# Patient Record
Sex: Female | Born: 1970 | Race: White | Hispanic: No | Smoking: Never smoker
Health system: Southern US, Community
[De-identification: ages and names within clinical notes are randomized; demographics above are authoritative.]

## PROBLEM LIST (undated history)

## (undated) DIAGNOSIS — E785 Hyperlipidemia, unspecified: Secondary | ICD-10-CM

## (undated) HISTORY — PX: REDUCTION MAMMAPLASTY: SUR839

## (undated) HISTORY — PX: EYE SURGERY: SHX253

## (undated) HISTORY — DX: Hyperlipidemia, unspecified: E78.5

## (undated) HISTORY — PX: TUBAL LIGATION: SHX77

## (undated) HISTORY — PX: VESICOVAGINAL FISTULA CLOSURE W/ TAH: SUR271

## (undated) HISTORY — PX: BREAST ENHANCEMENT SURGERY: SHX7

---

## 1997-12-10 ENCOUNTER — Other Ambulatory Visit: Admission: RE | Admit: 1997-12-10 | Discharge: 1997-12-10 | Payer: Self-pay | Admitting: Gynecology

## 1998-04-28 ENCOUNTER — Ambulatory Visit (HOSPITAL_COMMUNITY): Admission: AD | Admit: 1998-04-28 | Discharge: 1998-04-28 | Payer: Self-pay | Admitting: Obstetrics & Gynecology

## 1998-12-09 ENCOUNTER — Inpatient Hospital Stay (HOSPITAL_COMMUNITY): Admission: AD | Admit: 1998-12-09 | Discharge: 1998-12-11 | Payer: Self-pay | Admitting: *Deleted

## 1999-01-06 ENCOUNTER — Other Ambulatory Visit: Admission: RE | Admit: 1999-01-06 | Discharge: 1999-01-06 | Payer: Self-pay | Admitting: Obstetrics and Gynecology

## 1999-03-29 ENCOUNTER — Emergency Department (HOSPITAL_COMMUNITY): Admission: EM | Admit: 1999-03-29 | Discharge: 1999-03-29 | Payer: Self-pay | Admitting: Emergency Medicine

## 1999-09-15 ENCOUNTER — Other Ambulatory Visit: Admission: RE | Admit: 1999-09-15 | Discharge: 1999-09-15 | Payer: Self-pay | Admitting: Gynecology

## 2001-01-20 ENCOUNTER — Other Ambulatory Visit: Admission: RE | Admit: 2001-01-20 | Discharge: 2001-01-20 | Payer: Self-pay | Admitting: Gynecology

## 2001-09-16 ENCOUNTER — Encounter: Payer: Self-pay | Admitting: *Deleted

## 2001-09-16 ENCOUNTER — Ambulatory Visit (HOSPITAL_COMMUNITY): Admission: RE | Admit: 2001-09-16 | Discharge: 2001-09-16 | Payer: Self-pay | Admitting: *Deleted

## 2002-02-07 ENCOUNTER — Other Ambulatory Visit: Admission: RE | Admit: 2002-02-07 | Discharge: 2002-02-07 | Payer: Self-pay | Admitting: Gynecology

## 2003-02-13 ENCOUNTER — Other Ambulatory Visit: Admission: RE | Admit: 2003-02-13 | Discharge: 2003-02-13 | Payer: Self-pay | Admitting: Gynecology

## 2004-02-06 ENCOUNTER — Other Ambulatory Visit: Admission: RE | Admit: 2004-02-06 | Discharge: 2004-02-06 | Payer: Self-pay | Admitting: Gynecology

## 2005-01-29 ENCOUNTER — Other Ambulatory Visit: Admission: RE | Admit: 2005-01-29 | Discharge: 2005-01-29 | Payer: Self-pay | Admitting: Gynecology

## 2005-06-24 ENCOUNTER — Other Ambulatory Visit: Admission: RE | Admit: 2005-06-24 | Discharge: 2005-06-24 | Payer: Self-pay | Admitting: Gynecology

## 2005-07-13 ENCOUNTER — Other Ambulatory Visit: Admission: RE | Admit: 2005-07-13 | Discharge: 2005-07-13 | Payer: Self-pay | Admitting: Gynecology

## 2005-11-05 ENCOUNTER — Encounter (INDEPENDENT_AMBULATORY_CARE_PROVIDER_SITE_OTHER): Payer: Self-pay | Admitting: *Deleted

## 2005-11-05 ENCOUNTER — Ambulatory Visit (HOSPITAL_COMMUNITY): Admission: RE | Admit: 2005-11-05 | Discharge: 2005-11-06 | Payer: Self-pay | Admitting: *Deleted

## 2006-10-28 ENCOUNTER — Other Ambulatory Visit: Admission: RE | Admit: 2006-10-28 | Discharge: 2006-10-28 | Payer: Self-pay | Admitting: Gynecology

## 2007-02-02 ENCOUNTER — Encounter: Admission: RE | Admit: 2007-02-02 | Discharge: 2007-02-02 | Payer: Self-pay | Admitting: Family Medicine

## 2007-10-31 ENCOUNTER — Other Ambulatory Visit: Admission: RE | Admit: 2007-10-31 | Discharge: 2007-10-31 | Payer: Self-pay | Admitting: Gynecology

## 2010-12-19 NOTE — Discharge Summary (Signed)
NAMEKIONA, BLUME                 ACCOUNT NO.:  [id number]   MEDICAL RECORD NO.:  [id number]          PATIENT TYPE:  OIB   LOCATION:  9312                          FACILITY:  [facility]   PHYSICIAN:  Almedia Balls. Fore, M.D.   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  The remainder of her history and  physical is as previously dictated.   LABORATORY DATA:  Include preoperative hemoglobin 11.7.   HOSPITAL COURSE:  The patient was taken to operating room on the morning of  November 05, 2005, at which time laparoscopy and vaginal hysterectomy were  performed.  The patient did well postoperatively.  Diet and ambulation were  progressed over the evening of April 5 and the early morning of November 06, 2005.  On the morning November 06, 2005, she was afebrile and experiencing no  problems except for pain which was controlled by analgesics.  It was felt  that she could be discharged at this time.   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   OPERATION:  Laparoscopy vaginal hysterectomy.  Pathology report unavailable  time of dictation.   DISPOSITION:  Discharged home to return the office in two weeks for follow-  up.  She was instructed to gradually progress her activities over several  weeks at home and to limit lifting and driving for two weeks.  She was fully  ambulatory, on regular diet, and in good condition at time of discharge.  She was given prescription for Hydrocodone with APAP 10/325 mg, number 30,  to be taken 1-2 q.4-6h. p.r.n. pain and doxycycline 100 mg #12 to be taken  one b.i.d.           ______________________________  Almedia Balls. Randell Patient, M.D.     SRF/MEDQ  D:  11/06/2005  T:  11/06/2005  Job:  [id number]   cc:   Leatha Gilding. Mezer, M.D.  Fax: [phone]

## 2010-12-19 NOTE — H&P (Signed)
NAMEGRACEY, Veronica Peters                 ACCOUNT NO.:  [id number]   MEDICAL RECORD NO.:  [id number]          PATIENT TYPE:  AMB   LOCATION:  SDC                           [facility]ILITY[facility]  [facility]   PHYSICIAN:  Almedia Balls. Fore, M.D.   DATE OF BIRTH:  10-05-1970   DATE OF ADMISSION:  11/05/2005  DATE OF DISCHARGE:                                HISTORY & PHYSICAL   HISTORY OF PRESENT ILLNESS:  The patient is a 40 year old, G2, P2 who last  menstrual period was October 09, 2005.  Over the past year, she has had  increasingly severe menses with heavier flow and pain.  She has been  evaluated and followed by Dr. Teodora Medici who also found in July 2006, that  she had low-grade squamous intraepithelial lesion of the cervix documented  by colposcopically directed biopsies.  Repeat Pap smear in December 2006,  showed continuation of low-grade change.  Because of her abnormal bleeding  and pelvic pain associated with it and the squamous intraepithelial lesion,  she is admitted at this time for hysterectomy and possible bilateral  salpingo-oophorectomy.  She has been fully counseled as to the nature of the  procedure and the risks involved to include risks of anesthesia, injury to  bowel, bladder, blood vessels, ureters, postoperative hemorrhage, infection,  recuperation, possibility of hormone replacement should her ovaries be  removed.  She fully understands all these considerations and wishes to  proceed on November 05, 2005.   PAST MEDICAL HISTORY:  Tubal ligation in 2004.   MEDICATIONS:  She has been taking various nonsteroidal analgesics for her  pain associated with her menses and other pelvic pain.   ALLERGIES:  SEPTRA.   SOCIAL HISTORY:  She is a nonsmoker and nondrinker.   FAMILY HISTORY:  Grandmother with cancer of unknown type.  Grandfather with  cardiovascular disease.  Uncle with diabetes mellitus.   REVIEW OF SYSTEMS:  HEENT:  Wears glasses.  Has occasional headaches.  CARDIOPULMONARY:   Negative.  GASTROINTESTINAL:  Negative.  GENITOURINARY:  As in history of present illness.  NEUROMUSCULAR:  Negative.   PHYSICAL EXAMINATION:  VITAL SIGNS:  Height 5 feet 5.5 inches, weight 162  pounds, blood pressure 114/76, pulse 80, respirations 18.  GENERAL:  A well-developed, white female in no acute distress.  HEENT:  Within normal limits.  NECK:  Supple without masses, adenopathy or bruits.  HEART:  Regular rate and rhythm without murmurs.  LUNGS:  Clear to P&A.  BREASTS:  Sitting and lying without mass.  Axilla negative.  ABDOMEN:  Flat and soft without mass or tenderness.  PELVIC:  External genitalia, Bartholin, urethra and Skene glands within  normal limits.  Vagina is clean.  Cervix is slightly inflamed.  Uterus is  anterior and mid in position, top normal size, shape and contour.  There  were no palpable adnexal masses.  Anterior and posterior cul-de-sac exam is  confirmatory.  EXTREMITIES:  Within normal limits.  NEUROLOGIC:  Central nervous system grossly intact.  SKIN:  Without suspicious lesions.   IMPRESSION:  1.  Abnormal uterine bleeding.  2.  Pelvic pain.  3.  Persistent low-grade squamous intraepithelial lesion.   PLAN:  As noted above.           ______________________________  Almedia Balls. Randell Patient, M.D.     SRF/MEDQ  D:  10/29/2005  T:  10/30/2005  Job:  [id number]

## 2010-12-19 NOTE — Op Note (Signed)
Veronica Peters, Veronica Peters                 ACCOUNT NO.:  [id number]   MEDICAL RECORD NO.:  [id number]          PATIENT TYPE:  OIB   LOCATION:  9312                          [facility]ILITY:  [facility]   PHYSICIAN:  Veronica Peters, M.D.   DATE OF BIRTH:  28-Oct-1970   DATE OF PROCEDURE:  11/05/2005  DATE OF DISCHARGE:                                 OPERATIVE REPORT   PREOPERATIVE DIAGNOSIS:  1.  Abnormal uterine bleeding.  2.  Pelvic pain.  3.  Persisting low-grade squamous intraepithelial lesion.   POSTOPERATIVE DIAGNOSIS:  1.  Abnormal uterine bleeding.  2.  Pelvic pain.  3.  Persisting low-grade squamous intraepithelial lesion.  4.  Pending pathology.   OPERATION:  Laparoscopically-assisted vaginal hysterectomy.   ANESTHESIA:  General orotracheal.   OPERATOR:  Veronica Balls. Randell Patient, M.D.   FIRST ASSISTANT:  Gretta Cool, M.D.   INDICATIONS FOR SURGERY:  The patient is a 40 year old with the above-noted  problems who was counseled as to the need for surgery to treat these  problems and the necessary surgery to be performed.  She was also counseled  as to the risks involved to include risks of anesthesia, injury to bowel,  bladder, blood vessels, ureters, postoperative hemorrhage, infection,  recuperation, and possible hormone replacement should her ovaries be  removed.  She fully understands all these considerations and wishes to  proceed on November 05, 2005 and a signed informed consent to proceed on this  date as well.   OPERATIVE FINDINGS:  On laparoscopy, the lower liver edge, gallbladder,  spleen, and area of the appendix were normal to visualization.  In the  pelvis, the uterus was enlarged to approximately 8-[redacted] weeks gestational  size.  Both tubes had been previously fulgurated for sterilization attempt.  There was an adhesion involving the omentum to the right tube which was  lysed easily.  Both ovaries appeared normal.   DESCRIPTION OF PROCEDURE:  With the patient under general  anesthesia,  prepared and draped in the usual sterile fashion, in the dorsolithotomy  position, a speculum was placed in the vagina.  The cervix was grasped with  a Hulka tenaculum; and the patient was catheterized with free flow of clear  urine.  She was then prepared and draped in the usual sterile fashion for a  laparoscopic procedure.  An incision was made in the lower pole of the  umbilicus with insertion of the Veress cannula and insufflation of 3 liters  of carbon dioxide.  Reusable 10-mm trocar for the operative scope; and the  scope, itself, was then inserted into the peritoneal cavity.  Reusable 5-mm  probe was inserted through a stab wound just above the symphysis pubis.  The  above-noted findings were visualized.  Bipolar electrocoagulation was used  to rendered the adhesion hemostatic to the right tube which was then lysed  without difficulty.  The right uterine ovarian anastomosis tube and round  ligament were sequentially rendered hemostatic with bipolar  electrocoagulation and gradually transected for release from the right  uterine cornual area.  A similar procedure was carried out on  the left side  without difficulty.  A bladder flap was developed to some extent in the  lower uterine segment, midline, using both bipolar electrocoagulation and  sharp dissection.  At this point with hemostasis in the surgical areas in  the pelvis, the patient was repositioned for a vaginal procedure.   A weighted speculum was placed in the posterior vagina, and the cervix was  grasped with 2 Jacobs tenaculum.  A solution of 1% lidocaine with 1:100,000  epinephrine was injected circumferentially for a total of 10 mL for  delineation of fascial planes and hemostasis.  An incision was made in the  anterior vaginal mucosa from the 3 to 9 o'clock positions with further sharp  dissection and blunt dissection of the bladder off the cervix and lower  uterine segment.  The posterior cul-de-sac was  then entered without  difficulty.  Heaney clamps were used to clamp the uterosacral ligaments and  vaginal cuff angles bilaterally; these structures were cut free and  individually suture ligated with #1 chromic catgut.  The bladder was further  elevated off the lower uterine segment, and Heaney clamps were placed on the  lower portions of the cardinal ligaments bilaterally; these structures were  also cut free and suture ligated with #1 chromic catgut.  It was impossible  to enter the anterior cul-de-sac.  Heaney clamps were then placed across the  uterine vessels bilaterally; these structures were cut free and suture  ligated with #1 chromic catgut.  It was then possible to invert the uterus  after of small wedge of tissue had been removed from the fundus because of  the size.  The remaining attachments of the cardinal ligaments were clamped  using Heaney clamps, cut with removal of the uterus and cervix, and ligated  with free ties.  The area was lavaged with copious amounts of lactated  Ringers solution, after noting that hemostasis was maintained, a modified  McCall suture was placed in the posterior cul-de-sac to provide support for  the vaginal cuff and prevent enterocele.  This was accomplished with an  interrupted suture of #0 Vicryl.  The peritoneum was closed with a  pursestring suture of #0 Vicryl.  The vaginal cuff was rendered hemostatic  and reapproximated with continuous suture of #0 Vicryl.  The uterosacral  ligaments, which had been left long, were then tied together in the midline;  and the McCall suture in the posterior cul-de-sac area was then tied with  good elevation of the vaginal cuff.  The area was then lavaged in the  vagina, and the patient was catheterized with free flow of clear urine.   She was then repositioned and draped for the laparoscopic procedure.  The  suction irrigator device was placed through the lower abdominal probe with irrigation of the pelvis.   No active bleeders were noted.  It was felt that  the entire area was hemostatic.  After noting that this was the case, and  that sponge and instrument counts were correct, the instruments were removed  from the peritoneal cavity, and the gas was allowed to escape.  Incisions  were closed with fascial sutures of #0 Vicryl and subcuticular sutures of 3-  0 plain catgut.  Estimated blood loss 150 mL.  The patient was taken to the  recovery room in good condition.  She will be placed on an outpatient with  extended recovery status following PACU.           ______________________________  Veronica Peters,  M.D.     SRF/MEDQ  D:  11/05/2005  T:  11/06/2005  Job:  [id number]   cc:   Gretta Cool, M.D.  Fax: [phone]

## 2012-01-18 ENCOUNTER — Ambulatory Visit (INDEPENDENT_AMBULATORY_CARE_PROVIDER_SITE_OTHER): Payer: 59 | Admitting: Family Medicine

## 2012-01-18 VITALS — BP 132/82 | HR 70 | Temp 98.1°F | Resp 16 | Ht 65.0 in | Wt 195.0 lb

## 2012-01-18 DIAGNOSIS — J329 Chronic sinusitis, unspecified: Secondary | ICD-10-CM

## 2012-01-18 MED ORDER — AMOXICILLIN-POT CLAVULANATE 875-125 MG PO TABS: 1.0000 | ORAL_TABLET | Freq: Two times a day (BID) | ORAL | Status: AC

## 2012-01-18 NOTE — Progress Notes (Signed)
Subjective:    Patient ID: Veronica Peters, female    DOB: [birth year], 41 y.o.   MRN: [id number]  HPI Veronica Peters is a 41 y.o. female 1 week of sinus congestion, cough, pressure.  Occasionally feels a little better, then worse.  Green since yesterday.  No fever.  Occasional soreness with cough only. Rare sinus infection in past .  Headache.    Tx: Dayquil, mucinex, and motrin.   SH: Nonsmoker.   Review of Systems  HENT: Positive for congestion and sinus pressure. Negative for sore throat, trouble swallowing and voice change.   Respiratory: Positive for cough. Negative for shortness of breath.   Neurological: Positive for headaches.       Objective:   Physical Exam  Vitals reviewed. Constitutional: She is oriented to person, place, and time. She appears well-developed and well-nourished. No distress.  HENT:  Head: Normocephalic and atraumatic.    Right Ear: Hearing, tympanic membrane, external ear and ear canal normal.  Left Ear: Hearing, tympanic membrane, external ear and ear canal normal.  Nose: Nose normal. Right sinus exhibits no maxillary sinus tenderness and no frontal sinus tenderness. Left sinus exhibits no maxillary sinus tenderness and no frontal sinus tenderness.  Mouth/Throat: Oropharynx is clear and moist. No oropharyngeal exudate.  Eyes: Conjunctivae and EOM are normal. Pupils are equal, round, and reactive to light.  Cardiovascular: Normal rate, regular rhythm, normal heart sounds and intact distal pulses.   No murmur heard. Pulmonary/Chest: Effort normal and breath sounds normal. No respiratory distress. She has no wheezes. She has no rhonchi.  Neurological: She is alert and oriented to person, place, and time.  Skin: Skin is warm and dry. No rash noted.  Psychiatric: She has a normal mood and affect. Her behavior is normal.       Assessment & Plan:  Veronica Peters is a 40 y.o. female 1. Sinusitis    augmentin 875mg  BID x 10 days.  SED, rtc  precautions.  Patient Instructions  Saline nasal spray atleast 4 times per day, over the counter mucinex or mucinex DM, drink plenty of fluids.  Return to clinic if worsening. Sinusitis Sinuses are air pockets within the bones of your face. The growth of bacteria within a sinus leads to infection. The infection prevents the sinuses from draining. This infection is called sinusitis. SYMPTOMS  There will be different areas of pain depending on which sinuses have become infected.  The maxillary sinuses often produce pain beneath the eyes.   Frontal sinusitis may cause pain in the middle of the forehead and above the eyes.  Other problems (symptoms) include:  Toothaches.   Colored, pus-like (purulent) drainage from the nose.   Swelling, warmth, and tenderness over the sinus areas may be signs of infection.  TREATMENT  Sinusitis is most often determined by an exam.X-rays may be taken. If x-rays have been taken, make sure you obtain your results or find out how you are to obtain them. Your caregiver may give you medications (antibiotics). These are medications that will help kill the bacteria causing the infection. You may also be given a medication (decongestant) that helps to reduce sinus swelling.  HOME CARE INSTRUCTIONS   Only take over-the-counter or prescription medicines for pain, discomfort, or fever as directed by your caregiver.   Drink extra fluids. Fluids help thin the mucus so your sinuses can drain more easily.   Applying either moist heat or ice packs to the sinus areas may help relieve discomfort.  Use saline nasal sprays to help moisten your sinuses. The sprays can be found at your local drugstore.  SEEK IMMEDIATE MEDICAL CARE IF:  You have a fever.   You have increasing pain, severe headaches, or toothache.   You have nausea, vomiting, or drowsiness.   You develop unusual swelling around the face or trouble seeing.  MAKE SURE YOU:   Understand these instructions.    Will watch your condition.   Will get help right away if you are not doing well or get worse.  Document Released: 07/20/2005 Document Revised: 07/09/2011 Document Reviewed: 02/16/2007 [facility] Patient Information 2012 [place], [place].

## 2012-01-18 NOTE — Patient Instructions (Signed)
Saline nasal spray atleast 4 times per day, over the counter mucinex or mucinex DM, drink plenty of fluids.  Return to clinic if worsening. Sinusitis Sinuses are air pockets within the bones of your face. The growth of bacteria within a sinus leads to infection. The infection prevents the sinuses from draining. This infection is called sinusitis. SYMPTOMS  There will be different areas of pain depending on which sinuses have become infected.  The maxillary sinuses often produce pain beneath the eyes.   Frontal sinusitis may cause pain in the middle of the forehead and above the eyes.  Other problems (symptoms) include:  Toothaches.   Colored, pus-like (purulent) drainage from the nose.   Swelling, warmth, and tenderness over the sinus areas may be signs of infection.  TREATMENT  Sinusitis is most often determined by an exam.X-rays may be taken. If x-rays have been taken, make sure you obtain your results or find out how you are to obtain them. Your caregiver may give you medications (antibiotics). These are medications that will help kill the bacteria causing the infection. You may also be given a medication (decongestant) that helps to reduce sinus swelling.  HOME CARE INSTRUCTIONS   Only take over-the-counter or prescription medicines for pain, discomfort, or fever as directed by your caregiver.   Drink extra fluids. Fluids help thin the mucus so your sinuses can drain more easily.   Applying either moist heat or ice packs to the sinus areas may help relieve discomfort.   Use saline nasal sprays to help moisten your sinuses. The sprays can be found at your local drugstore.  SEEK IMMEDIATE MEDICAL CARE IF:  You have a fever.   You have increasing pain, severe headaches, or toothache.   You have nausea, vomiting, or drowsiness.   You develop unusual swelling around the face or trouble seeing.  MAKE SURE YOU:   Understand these instructions.   Will watch your condition.    Will get help right away if you are not doing well or get worse.  Document Released: 07/20/2005 Document Revised: 07/09/2011 Document Reviewed: 02/16/2007 [facility] Patient Information 2012 [place], [place].

## 2012-04-14 ENCOUNTER — Ambulatory Visit (INDEPENDENT_AMBULATORY_CARE_PROVIDER_SITE_OTHER): Payer: 59 | Admitting: Family Medicine

## 2012-04-14 VITALS — BP 124/82 | HR 86 | Temp 98.0°F | Resp 14 | Ht 65.5 in | Wt 194.8 lb

## 2012-04-14 DIAGNOSIS — J0100 Acute maxillary sinusitis, unspecified: Secondary | ICD-10-CM

## 2012-04-14 MED ORDER — METHYLPREDNISOLONE ACETATE 80 MG/ML IJ SUSP
80.0000 mg | Freq: Once | INTRAMUSCULAR | Status: AC
  Administered 2012-04-14: 80 mg via INTRAMUSCULAR

## 2012-04-14 MED ORDER — AZITHROMYCIN 250 MG PO TABS: ORAL_TABLET | ORAL | Status: AC

## 2012-04-14 NOTE — Progress Notes (Signed)
Reviewed and agree.

## 2012-04-14 NOTE — Progress Notes (Signed)
  Subjective:    Patient ID: Veronica Peters, female    DOB: May 19, 1971, 41 y.o.   MRN: [id number]  HPI   Son and family also sick. Pain w/ swallowing and painful glands, nasal congestion, head pressure. OTC products not helping - tried vics dayquil, claritin, allegra - opened sinuses a little so got more rhinorrhea. Son tested neg for strep - diagnosed w/ poss bronchitis. 5d progressively worsening. No f/c.  H/o sinus infections and bad seasonal allergies. Able to eat and drink. Not sleeping as feeling so poorly. No cough/n/v/c/d, no shob, no cp.    Review of Systems  Constitutional: Positive for activity change. Negative for fever and chills.  HENT: Positive for congestion, rhinorrhea and neck pain. Negative for hearing loss, ear pain, nosebleeds, facial swelling, neck stiffness and postnasal drip.   Respiratory: Negative for cough, chest tightness and shortness of breath.   Cardiovascular: Negative for chest pain.  Gastrointestinal: Negative for nausea, vomiting, diarrhea and constipation.  Genitourinary: Negative for decreased urine volume.  Neurological: Positive for headaches.  Psychiatric/Behavioral: Positive for disturbed wake/sleep cycle.       Objective:   Physical Exam  Constitutional: She is oriented to person, place, and time. She appears well-developed and well-nourished. No distress.  HENT:  Head: Normocephalic and atraumatic.  Right Ear: External ear and ear canal normal. Tympanic membrane is injected and retracted.  Left Ear: External ear and ear canal normal. Tympanic membrane is retracted.  Nose: Mucosal edema and rhinorrhea present. Left sinus exhibits maxillary sinus tenderness.  Mouth/Throat: Uvula is midline, oropharynx is clear and moist and mucous membranes are normal. Mucous membranes are not dry. No oropharyngeal exudate.  Eyes: EOM are normal. Pupils are equal, round, and reactive to light. No scleral icterus.  Neck: Normal range of motion. Neck supple. No  thyromegaly present.  Cardiovascular: Normal rate, regular rhythm and normal heart sounds.   Pulmonary/Chest: Effort normal and breath sounds normal. No respiratory distress. She has no wheezes.  Lymphadenopathy:    She has cervical adenopathy.  Neurological: She is alert and oriented to person, place, and time.  Skin: Skin is warm and dry. She is not diaphoretic.  Psychiatric: She has a normal mood and affect.          Assessment & Plan:  1. Sinusitis - depomedrol in office for sxs relief. Start netti pot, steam, salt water gargles. Gave snap rx for z-pack to fill if sxs worsen or fever develops.

## 2012-04-14 NOTE — Patient Instructions (Addendum)
Start using a netti pot today. Apply steam/heat frequently. Gargle with salt water and drink hot tea w/ honey and lemon. Use nasal saline frequently and consider continuing on otc zyrtec (cetirizine) for seasonal allergies. If your symptoms get worse or you develop a fever, fill your prescription for azithromycin. Sinusitis Sinuses are air pockets within the bones of your face. The growth of bacteria within a sinus leads to infection. The infection prevents the sinuses from draining. This infection is called sinusitis. SYMPTOMS  There will be different areas of pain depending on which sinuses have become infected.  The maxillary sinuses often produce pain beneath the eyes.   Frontal sinusitis may cause pain in the middle of the forehead and above the eyes.  Other problems (symptoms) include:  Toothaches.   Colored, pus-like (purulent) drainage from the nose.   Swelling, warmth, and tenderness over the sinus areas may be signs of infection.  TREATMENT  Sinusitis is most often determined by an exam.X-rays may be taken. If x-rays have been taken, make sure you obtain your results or find out how you are to obtain them. Your caregiver may give you medications (antibiotics). These are medications that will help kill the bacteria causing the infection. You may also be given a medication (decongestant) that helps to reduce sinus swelling.  HOME CARE INSTRUCTIONS   Only take over-the-counter or prescription medicines for pain, discomfort, or fever as directed by your caregiver.   Drink extra fluids. Fluids help thin the mucus so your sinuses can drain more easily.   Applying either moist heat or ice packs to the sinus areas may help relieve discomfort.   Use saline nasal sprays to help moisten your sinuses. The sprays can be found at your local drugstore.  SEEK IMMEDIATE MEDICAL CARE IF:  You have a fever.   You have increasing pain, severe headaches, or toothache.   You have nausea,  vomiting, or drowsiness.   You develop unusual swelling around the face or trouble seeing.  MAKE SURE YOU:   Understand these instructions.   Will watch your condition.   Will get help right away if you are not doing well or get worse.  Document Released: 07/20/2005 Document Revised: 07/09/2011 Document Reviewed: 02/16/2007 [facility] Patient Information 2012 [place], [place].

## 2012-07-05 ENCOUNTER — Ambulatory Visit (INDEPENDENT_AMBULATORY_CARE_PROVIDER_SITE_OTHER): Payer: 59 | Admitting: Internal Medicine

## 2012-07-05 VITALS — BP 119/80 | HR 76 | Temp 98.2°F | Resp 16 | Ht 66.5 in | Wt 194.4 lb

## 2012-07-05 DIAGNOSIS — J029 Acute pharyngitis, unspecified: Secondary | ICD-10-CM

## 2012-07-05 LAB — POCT RAPID STREP A (OFFICE): Rapid Strep A Screen: NEGATIVE

## 2012-07-05 MED ORDER — HYDROCODONE-ACETAMINOPHEN 7.5-325 MG/15ML PO SOLN: 5.0000 mL | Freq: Four times a day (QID) | ORAL | Status: DC | PRN

## 2012-07-05 NOTE — Progress Notes (Signed)
  Subjective:    Patient ID: Veronica Peters, female    DOB: 04-Jul-1971, 41 y.o.   MRN: [id number]  HPI 2days of st, no cough, sob,cp. No fever, does have 2 small children   Review of Systems     Objective:   Physical Exam  Vitals reviewed. Constitutional: She is oriented to person, place, and time. She appears well-nourished. No distress.  HENT:  Right Ear: External ear normal.  Left Ear: External ear normal.  Nose: Nose normal.  Mouth/Throat: Oropharyngeal exudate present.  Neck: Neck supple. No thyromegaly present.  Cardiovascular: Normal rate.   Pulmonary/Chest: Effort normal.  Lymphadenopathy:    She has no cervical adenopathy.  Neurological: She is alert and oriented to person, place, and time. Coordination normal.  Skin: Skin is warm and dry.    Results for orders placed in visit on 07/05/12  POCT RAPID STREP A (OFFICE)      Component Value Range   Rapid Strep A Screen Negative  Negative        Assessment & Plan:  Lortab elixir

## 2012-07-05 NOTE — Patient Instructions (Addendum)
Viral Pharyngitis  Viral pharyngitis is a viral infection that produces redness, pain, and swelling (inflammation) of the throat. It can spread from person to person (contagious).  CAUSES  Viral pharyngitis is caused by inhaling a large amount of certain germs called viruses. Many different viruses cause viral pharyngitis.  SYMPTOMS  Symptoms of viral pharyngitis include:   Sore throat.   Tiredness.   Stuffy nose.   Low-grade fever.   Congestion.   Cough.  TREATMENT  Treatment includes rest, drinking plenty of fluids, and the use of over-the-counter medication (approved by your caregiver).  HOME CARE INSTRUCTIONS    Drink enough fluids to keep your urine clear or pale yellow.   Eat soft, cold foods such as ice cream, frozen ice pops, or gelatin dessert.   Gargle with warm salt water (1 tsp salt per 1 qt of water).   If over age 7, throat lozenges may be used safely.   Only take over-the-counter or prescription medicines for pain, discomfort, or fever as directed by your caregiver. Do not take aspirin.  To help prevent spreading viral pharyngitis to others, avoid:   Mouth-to-mouth contact with others.   Sharing utensils for eating and drinking.   Coughing around others.  SEEK MEDICAL CARE IF:    You are better in a few days, then become worse.   You have a fever or pain not helped by pain medicines.   There are any other changes that concern you.  Document Released: 04/29/2005 Document Revised: 10/12/2011 Document Reviewed: 09/25/2010  ExitCare Patient Information 2013 [employer].

## 2013-09-06 ENCOUNTER — Ambulatory Visit (INDEPENDENT_AMBULATORY_CARE_PROVIDER_SITE_OTHER): Payer: 59 | Admitting: Family Medicine

## 2013-09-06 VITALS — BP 122/88 | HR 88 | Temp 98.6°F | Resp 18 | Ht 65.0 in | Wt 182.6 lb

## 2013-09-06 DIAGNOSIS — J029 Acute pharyngitis, unspecified: Secondary | ICD-10-CM

## 2013-09-06 LAB — POCT RAPID STREP A (OFFICE): Rapid Strep A Screen: NEGATIVE

## 2013-09-06 MED ORDER — MAGIC MOUTHWASH W/LIDOCAINE: 10.0000 mL | ORAL | Status: DC | PRN

## 2013-09-06 MED ORDER — AZITHROMYCIN 250 MG PO TABS: ORAL_TABLET | ORAL | Status: DC

## 2013-09-06 NOTE — Patient Instructions (Signed)
Drink plenty of fluids and get enough rest  Take Benadryl (diphenhydramine) 2 tablets at bedtime  Use the Magic mouthwash as directed.  If not doing better by Saturday take the azithromycin as directed. If you do not need that prescription please turn out.

## 2013-09-06 NOTE — Progress Notes (Signed)
Subjective: 43 year old 2078 who has been having symptoms since Sunday, 3 or 4 days ago. She does not smoke. She works in an office. She has had a scratchy throat which is progressively got more irritated. She has started taking some Sudafed which has opened her up and she is getting some drainage. She has not had a fever. She does not describe much in the way of her cough. No one else at home has been ill, but her fianc has been on the road a lot of the time.  Objective: Pleasant alert lady in no major distress. Her TMs are normal. Throat has some mild erythema without exudate. Neck supple without significant nodes. Chest is clear to auscultation. Heart regular without any murmurs.  Assessment: Pharyngitis, viral versus bacterial  Plan: Strep screen  Results for orders placed in visit on 09/06/13  POCT RAPID STREP A (OFFICE)      Result Value Range   Rapid Strep A Screen Negative  Negative   Viral pharyngitis. However with her symptoms I am going and giving her a prescription to use for an antibiotic if she's not improving by this weekend.

## 2013-09-08 LAB — CULTURE, GROUP A STREP: Organism ID, Bacteria: NORMAL

## 2015-12-03 ENCOUNTER — Ambulatory Visit (INDEPENDENT_AMBULATORY_CARE_PROVIDER_SITE_OTHER): Payer: 59 | Admitting: Family Medicine

## 2015-12-03 VITALS — BP 124/70 | HR 97 | Temp 98.6°F | Resp 16 | Ht 65.0 in | Wt 169.8 lb

## 2015-12-03 DIAGNOSIS — J019 Acute sinusitis, unspecified: Secondary | ICD-10-CM

## 2015-12-03 MED ORDER — BENZONATATE 200 MG PO CAPS: 200.0000 mg | ORAL_CAPSULE | Freq: Three times a day (TID) | ORAL | Status: DC | PRN

## 2015-12-03 MED ORDER — METHYLPREDNISOLONE ACETATE 80 MG/ML IJ SUSP
80.0000 mg | Freq: Once | INTRAMUSCULAR | Status: AC
  Administered 2015-12-03: 80 mg via INTRAMUSCULAR

## 2015-12-03 MED ORDER — AMOXICILLIN-POT CLAVULANATE 875-125 MG PO TABS: 1.0000 | ORAL_TABLET | Freq: Two times a day (BID) | ORAL | Status: DC

## 2015-12-03 MED ORDER — MUCINEX DM MAXIMUM STRENGTH 60-1200 MG PO TB12: 1.0000 | ORAL_TABLET | Freq: Two times a day (BID) | ORAL | Status: DC

## 2015-12-03 NOTE — Progress Notes (Signed)
Subjective:  By signing my name below, I, Stann Ore, attest that this documentation has been prepared under the direction and in the presence of Norberto Sorenson, MD. Electronically Signed: Stann Ore, Scribe. 12/03/2015 , 1:56 PM .  Patient was seen in Room 7 .   Patient ID: Veronica Peters, female    DOB: 1970/12/23, 45 y.o.   MRN: [id number] Chief Complaint  Patient presents with  . Cough  . Ear Pain   HPI Veronica Peters is a 45 y.o. female who presents to [facility] complaining of cough for 2 weeks. Patient states having a deep and persistent cough. She can feel it in her chest. Her cough worsened and had an episode of vomiting last night. She also feels ear pain bilaterally with throat pain or pain in her lymph node. She's taken tylenol, ibuprofen and robitussin without relief. Her husband has similar symptoms as well.   History reviewed. No pertinent past medical history. Prior to Admission medications   Medication Sig Start Date End Date Taking? Authorizing Provider  buPROPion (WELLBUTRIN XL) 300 MG 24 hr tablet Take 300 mg by mouth daily.   Yes Historical Provider, MD  LORazepam (ATIVAN) 0.5 MG tablet Take 0.5 mg by mouth every 8 (eight) hours.   Yes Historical Provider, MD  Alum & Mag Hydroxide-Simeth (MAGIC MOUTHWASH W/LIDOCAINE) SOLN Take 10 mLs by mouth every 2 (two) hours as needed for mouth pain. Patient not taking: Reported on 12/03/2015 09/06/13   Peyton Najjar, MD  azithromycin (ZITHROMAX) 250 MG tablet Take 2 initially, then one daily for 4 days for throat. Patient not taking: Reported on 12/03/2015 09/06/13   Peyton Najjar, MD  guaiFENesin (MUCINEX) 600 MG 12 hr tablet Take 1,200 mg by mouth 2 (two) times daily. Reported on 12/03/2015    Historical Provider, MD  hydrocodone-acetaminophen (HYCET) 7.5-325 MG/15ML solution Take 5 mLs by mouth every 6 (six) hours as needed for pain (or cough). Patient not taking: Reported on 12/03/2015 07/05/12   Jonita Albee, MD  loratadine (CLARITIN) 10 MG  tablet Take 10 mg by mouth daily. Reported on 12/03/2015    Historical Provider, MD  sertraline (ZOLOFT) 100 MG tablet Take 200 mg by mouth daily. Reported on 12/03/2015    Historical Provider, MD  simvastatin (ZOCOR) 40 MG tablet Take 40 mg by mouth every evening. Reported on 12/03/2015    Historical Provider, MD   Allergies  Allergen Reactions  . Sulfa Antibiotics Hives    Review of Systems  Constitutional: Negative for fever, chills and fatigue.  HENT: Positive for ear pain. Negative for sore throat.   Respiratory: Positive for cough and chest tightness. Negative for shortness of breath and wheezing.   Gastrointestinal: Positive for vomiting. Negative for nausea and diarrhea.  Hematological: Positive for adenopathy.       Objective:   Physical Exam  Constitutional: She is oriented to person, place, and time. She appears well-developed and well-nourished. No distress.  HENT:  Head: Normocephalic and atraumatic.  Right Ear: Tympanic membrane is injected and erythematous.  Left Ear: Tympanic membrane normal.  Nose: Nose normal.  Mouth/Throat: Posterior oropharyngeal edema (some) and posterior oropharyngeal erythema present.  Eyes: EOM are normal. Pupils are equal, round, and reactive to light.  Neck: Neck supple. No thyromegaly present.  Cardiovascular: Normal rate, regular rhythm, S1 normal, S2 normal and normal heart sounds.   No murmur heard. Pulmonary/Chest: Effort normal and breath sounds normal. No respiratory distress.  Musculoskeletal: Normal range of motion.  Lymphadenopathy:       Head (right side): Tonsillar adenopathy present.       Head (left side): Tonsillar adenopathy present.    She has cervical adenopathy (anterior).  Neurological: She is alert and oriented to person, place, and time.  Skin: Skin is warm and dry.  Psychiatric: She has a normal mood and affect. Her behavior is normal.  Nursing note and vitals reviewed.   BP 124/70 mmHg  Pulse 97  Temp(Src) 98.6  F (37 C) (Oral)  Resp 16  Ht 5\' 5"  (1.651 m)  Wt 169 lb 12.8 oz (77.021 kg)  BMI 28.26 kg/m2  SpO2 98%    Assessment & Plan:   1. Acute sinusitis, recurrence not specified, unspecified location     Meds ordered this encounter  Medications  . methylPREDNISolone acetate (DEPO-MEDROL) injection 80 mg    Sig:   . amoxicillin-clavulanate (AUGMENTIN) 875-125 MG tablet    Sig: Take 1 tablet by mouth 2 (two) times daily.    Dispense:  20 tablet    Refill:  0  . benzonatate (TESSALON) 200 MG capsule    Sig: Take 1 capsule (200 mg total) by mouth 3 (three) times daily as needed for cough.    Dispense:  30 capsule    Refill:  0  . Dextromethorphan-Guaifenesin (MUCINEX DM MAXIMUM STRENGTH) 60-1200 MG TB12    Sig: Take 1 tablet by mouth every 12 (twelve) hours.    Dispense:  14 each    Refill:  1    I personally performed the services described in this documentation, which was scribed in my presence. The recorded information has been reviewed and considered, and addended by me as needed.  Norberto SorensonEva Shaw, MD MPH

## 2015-12-03 NOTE — Patient Instructions (Addendum)
   IF you received an x-ray today, you will receive an invoice from [facility]. Please contact [facility] at [phone] with questions or concerns regarding your invoice.   IF you received labwork today, you will receive an invoice from [facility]. Please contact [facility] at [phone] with questions or concerns regarding your invoice.   Our billing staff will not be able to assist you with questions regarding bills from these companies.  You will be contacted with the lab results as soon as they are available. The fastest way to get your results is to activate your My Chart account. Instructions are located on the last page of this paperwork. If you have not heard from us regarding the results in 2 weeks, please contact this office.     Sinusitis, Adult Sinusitis is redness, soreness, and inflammation of the paranasal sinuses. Paranasal sinuses are air pockets within the bones of your face. They are located beneath your eyes, in the middle of your forehead, and above your eyes. In healthy paranasal sinuses, mucus is able to drain out, and air is able to circulate through them by way of your nose. However, when your paranasal sinuses are inflamed, mucus and air can become trapped. This can allow bacteria and other germs to grow and cause infection. Sinusitis can develop quickly and last only a short time (acute) or continue over a long period (chronic). Sinusitis that lasts for more than 12 weeks is considered chronic. CAUSES Causes of sinusitis include:  Allergies.  Structural abnormalities, such as displacement of the cartilage that separates your nostrils (deviated septum), which can decrease the air flow through your nose and sinuses and affect sinus drainage.  Functional abnormalities, such as when the small hairs (cilia) that line your sinuses and help remove mucus do not work properly or are not present. SIGNS AND SYMPTOMS Symptoms  of acute and chronic sinusitis are the same. The primary symptoms are pain and pressure around the affected sinuses. Other symptoms include:  Upper toothache.  Earache.  Headache.  Bad breath.  Decreased sense of smell and taste.  A cough, which worsens when you are lying flat.  Fatigue.  Fever.  Thick drainage from your nose, which often is green and may contain pus (purulent).  Swelling and warmth over the affected sinuses. DIAGNOSIS Your health care provider will perform a physical exam. During your exam, your health care provider may perform any of the following to help determine if you have acute sinusitis or chronic sinusitis:  Look in your nose for signs of abnormal growths in your nostrils (nasal polyps).  Tap over the affected sinus to check for signs of infection.  View the inside of your sinuses using an imaging device that has a light attached (endoscope). If your health care provider suspects that you have chronic sinusitis, one or more of the following tests may be recommended:  Allergy tests.  Nasal culture. A sample of mucus is taken from your nose, sent to a lab, and screened for bacteria.  Nasal cytology. A sample of mucus is taken from your nose and examined by your health care provider to determine if your sinusitis is related to an allergy. TREATMENT Most cases of acute sinusitis are related to a viral infection and will resolve on their own within 10 days. Sometimes, medicines are prescribed to help relieve symptoms of both acute and chronic sinusitis. These may include pain medicines, decongestants, nasal steroid sprays, or saline sprays. However, for sinusitis related   to a bacterial infection, your health care provider will prescribe antibiotic medicines. These are medicines that will help kill the bacteria causing the infection. Rarely, sinusitis is caused by a fungal infection. In these cases, your health care provider will prescribe antifungal  medicine. For some cases of chronic sinusitis, surgery is needed. Generally, these are cases in which sinusitis recurs more than 3 times per year, despite other treatments. HOME CARE INSTRUCTIONS  Drink plenty of water. Water helps thin the mucus so your sinuses can drain more easily.  Use a humidifier.  Inhale steam 3-4 times a day (for example, sit in the bathroom with the shower running).  Apply a warm, moist washcloth to your face 3-4 times a day, or as directed by your health care provider.  Use saline nasal sprays to help moisten and clean your sinuses.  Take medicines only as directed by your health care provider.  If you were prescribed either an antibiotic or antifungal medicine, finish it all even if you start to feel better. SEEK IMMEDIATE MEDICAL CARE IF:  You have increasing pain or severe headaches.  You have nausea, vomiting, or drowsiness.  You have swelling around your face.  You have vision problems.  You have a stiff neck.  You have difficulty breathing.   This information is not intended to replace advice given to you by your health care provider. Make sure you discuss any questions you have with your health care provider.   Document Released: 07/20/2005 Document Revised: 08/10/2014 Document Reviewed: 08/04/2011 Elsevier Interactive Patient Education 2016 Elsevier Inc.  

## 2016-07-09 ENCOUNTER — Other Ambulatory Visit: Payer: Self-pay | Admitting: Family Medicine

## 2016-07-09 DIAGNOSIS — Z1231 Encounter for screening mammogram for malignant neoplasm of breast: Secondary | ICD-10-CM

## 2016-08-05 ENCOUNTER — Ambulatory Visit
Admission: RE | Admit: 2016-08-05 | Discharge: 2016-08-05 | Disposition: A | Discharge status: Home / Self Care | Payer: 59 | Source: Ambulatory Visit | Attending: Family Medicine | Admitting: Family Medicine

## 2016-08-05 DIAGNOSIS — Z1231 Encounter for screening mammogram for malignant neoplasm of breast: Secondary | ICD-10-CM

## 2016-08-06 ENCOUNTER — Ambulatory Visit (INDEPENDENT_AMBULATORY_CARE_PROVIDER_SITE_OTHER): Payer: 59 | Admitting: Family Medicine

## 2016-08-06 VITALS — BP 106/78 | HR 80 | Temp 98.0°F | Resp 16 | Ht 65.0 in | Wt 176.0 lb

## 2016-08-06 DIAGNOSIS — R590 Localized enlarged lymph nodes: Secondary | ICD-10-CM | POA: Diagnosis not present

## 2016-08-06 DIAGNOSIS — R69 Illness, unspecified: Secondary | ICD-10-CM | POA: Diagnosis not present

## 2016-08-06 LAB — POCT INFLUENZA A/B
Influenza A, POC: NEGATIVE
Influenza B, POC: NEGATIVE

## 2016-08-06 MED ORDER — IPRATROPIUM BROMIDE 0.03 % NA SOLN: 2.0000 | Freq: Two times a day (BID) | NASAL | 0 refills | Status: DC

## 2016-08-06 MED ORDER — PREDNISONE 20 MG PO TABS: 40.0000 mg | ORAL_TABLET | Freq: Every day | ORAL | 0 refills | Status: DC

## 2016-08-06 NOTE — Patient Instructions (Addendum)
Start Prednisone 40 mg x five days with breakfast or lunch.  Start Atrovent nasal spray, 2 sprays per nares twice daily until ear fullness and nasal congestion resolves.  All tests negative. Throat culture pending.   IF you received an x-ray today, you will receive an invoice from [facility]. Please contact [facility] at [phone] with questions or concerns regarding your invoice.   IF you received labwork today, you will receive an invoice from [facility]. Please contact [facility] at [phone] with questions or concerns regarding your invoice.   Our billing staff will not be able to assist you with questions regarding bills from these companies.  You will be contacted with the lab results as soon as they are available. The fastest way to get your results is to activate your My Chart account. Instructions are located on the last page of this paperwork. If you have not heard from us regarding the results in 2 weeks, please contact this office.     Lymphadenopathy Introduction Lymphadenopathy refers to swollen or enlarged lymph glands, also called lymph nodes. Lymph glands are part of your body's defense (immune) system, which protects the body from infections, germs, and diseases. Lymph glands are found in many locations in your body, including the neck, underarm, and groin. Many things can cause lymph glands to become enlarged. When your immune system responds to germs, such as viruses or bacteria, infection-fighting cells and fluid build up. This causes the glands to grow in size. Usually, this is not something to worry about. The swelling and any soreness often go away without treatment. However, swollen lymph glands can also be caused by a number of diseases. Your health care provider may do various tests to help determine the cause. If the cause of your swollen lymph glands cannot be found, it is important to monitor your condition to make sure the swelling goes  away. Follow these instructions at home: Watch your condition for any changes. The following actions may help to lessen any discomfort you are feeling:  Get plenty of rest.  Take medicines only as directed by your health care provider. Your health care provider may recommend over-the-counter medicines for pain.  Apply moist heat compresses to the site of swollen lymph nodes as directed by your health care provider. This can help reduce any pain.  Check your lymph nodes daily for any changes.  Keep all follow-up visits as directed by your health care provider. This is important. Contact a health care provider if:  Your lymph nodes are still swollen after 2 weeks.  Your swelling increases or spreads to other areas.  Your lymph nodes are hard, seem fixed to the skin, or are growing rapidly.  Your skin over the lymph nodes is red and inflamed.  You have a fever.  You have chills.  You have fatigue.  You develop a sore throat.  You have abdominal pain.  You have weight loss.  You have night sweats. Get help right away if:  You notice fluid leaking from the area of the enlarged lymph node.  You have severe pain in any area of your body.  You have chest pain.  You have shortness of breath. This information is not intended to replace advice given to you by your health care provider. Make sure you discuss any questions you have with your health care provider. Document Released: 04/28/2008 Document Revised: 12/26/2015 Document Reviewed: 02/22/2014  2017 Elsevier

## 2016-08-06 NOTE — Progress Notes (Signed)
Patient ID: Veronica RankKaren R Peters, female    DOB: 10/14/1970, 46 y.o.   MRN: [id number]  PCP: Lupita RaiderSHAW,KIMBERLEE, MD  Chief Complaint  Patient presents with  . swollen lymph node    Began 3 days ago  . Headache    Subjective:  HPI 46 year old, female presents for evaluation of left parotid swollen lymph nodes and headache x 3 days  Both ears were hurting and left side of throat painful. No nausea or  vomiting, appetite normal. No direct interaction with children. Experiencing symptoms of fatigue. Denies any recent illness.   Social History   Social History  . Marital status: Divorced    Spouse name: N/A  . Number of children: N/A  . Years of education: N/A   Occupational History  . Not on file.   Social History Main Topics  . Smoking status: Never Smoker  . Smokeless tobacco: Not on file  . Alcohol use No  . Drug use: No  . Sexual activity: No     Comment: 0 sexual partners in last 12 months   Other Topics Concern  . Not on file   Social History Narrative  . No narrative on file   No family history on file. Review of Systems  HPI Allergies  Allergen Reactions  . Sulfa Antibiotics Hives    Prior to Admission medications   Medication Sig Start Date End Date Taking? Authorizing Provider  LORazepam (ATIVAN) 0.5 MG tablet Take 0.5 mg by mouth every 8 (eight) hours.   Yes Historical Provider, MD  amoxicillin-clavulanate (AUGMENTIN) 875-125 MG tablet Take 1 tablet by mouth 2 (two) times daily. Patient not taking: Reported on 08/06/2016 12/03/15   Sherren MochaEva N Shaw, MD  benzonatate (TESSALON) 200 MG capsule Take 1 capsule (200 mg total) by mouth 3 (three) times daily as needed for cough. Patient not taking: Reported on 08/06/2016 12/03/15   Sherren MochaEva N Shaw, MD  buPROPion (WELLBUTRIN XL) 300 MG 24 hr tablet Take 300 mg by mouth daily.    Historical Provider, MD  Dextromethorphan-Guaifenesin (MUCINEX DM MAXIMUM STRENGTH) 60-1200 MG TB12 Take 1 tablet by mouth every 12 (twelve) hours. Patient not  taking: Reported on 08/06/2016 12/03/15   Sherren MochaEva N Shaw, MD  guaiFENesin (MUCINEX) 600 MG 12 hr tablet Take 1,200 mg by mouth 2 (two) times daily. Reported on 12/03/2015    Historical Provider, MD  loratadine (CLARITIN) 10 MG tablet Take 10 mg by mouth daily. Reported on 12/03/2015    Historical Provider, MD  sertraline (ZOLOFT) 100 MG tablet Take 200 mg by mouth daily. Reported on 12/03/2015    Historical Provider, MD  simvastatin (ZOCOR) 40 MG tablet Take 40 mg by mouth every evening. Reported on 12/03/2015    Historical Provider, MD    Past Medical, Surgical Family and Social History reviewed and updated.    Objective:   Today's Vitals   08/06/16 1056  BP: 106/78  Pulse: 80  Resp: 16  Temp: 98 F (36.7 C)  TempSrc: Oral  SpO2: 98%  Weight: 176 lb (79.8 kg)  Height: 5\' 5"  (1.651 m)    Wt Readings from Last 3 Encounters:  08/06/16 176 lb (79.8 kg)  12/03/15 169 lb 12.8 oz (77 kg)  09/06/13 182 lb 9.6 oz (82.8 kg)    Physical Exam  Constitutional: She is oriented to person, place, and time. She appears well-developed and well-nourished.  HENT:  Head: Normocephalic and atraumatic.  Right Ear: External ear normal.  Left Ear: External ear normal.  Nose: Nose normal.  Mouth/Throat: Oropharynx is clear and moist.  Eyes: Conjunctivae and EOM are normal. Pupils are equal, round, and reactive to light.  Neck: Normal range of motion. Neck supple.  Cardiovascular: Normal rate, regular rhythm, normal heart sounds and intact distal pulses.   Pulmonary/Chest: Effort normal and breath sounds normal.  Musculoskeletal: Normal range of motion.  Neurological: She is alert and oriented to person, place, and time.  Skin: Skin is warm and dry.  Psychiatric: She has a normal mood and affect. Her behavior is normal. Judgment and thought content normal.     Assessment & Plan:  1. Illness - Culture, Group A Strep - POCT Influenza A/B - Ipratropium (Atrovent) 2 sprays, twice daily as needed for ear  congestion.  2. Left cervical lymphadenopathy -Prednisone 40 mg with breakfast once daily x 5 days.  Return for follow-up if symptoms persist.  Godfrey Pick. Tiburcio Pea, MSN, FNP-C [facility]

## 2016-08-09 LAB — CULTURE, GROUP A STREP: Strep A Culture: NEGATIVE

## 2016-08-30 ENCOUNTER — Emergency Department (HOSPITAL_COMMUNITY)
Admission: EM | Admit: 2016-08-30 | Discharge: 2016-08-30 | Disposition: A | Discharge status: Home / Self Care | Payer: 59 | Attending: Emergency Medicine | Admitting: Emergency Medicine

## 2016-08-30 ENCOUNTER — Encounter (HOSPITAL_COMMUNITY): Payer: Self-pay

## 2016-08-30 ENCOUNTER — Emergency Department (HOSPITAL_COMMUNITY): Payer: 59

## 2016-08-30 DIAGNOSIS — M545 Low back pain: Secondary | ICD-10-CM | POA: Diagnosis present

## 2016-08-30 DIAGNOSIS — M5442 Lumbago with sciatica, left side: Secondary | ICD-10-CM | POA: Diagnosis not present

## 2016-08-30 DIAGNOSIS — Z79899 Other long term (current) drug therapy: Secondary | ICD-10-CM | POA: Diagnosis not present

## 2016-08-30 MED ORDER — NAPROXEN 500 MG PO TABS: 500.0000 mg | ORAL_TABLET | Freq: Two times a day (BID) | ORAL | 0 refills | Status: DC

## 2016-08-30 MED ORDER — DEXAMETHASONE SODIUM PHOSPHATE 10 MG/ML IJ SOLN
10.0000 mg | Freq: Once | INTRAMUSCULAR | Status: AC
  Administered 2016-08-30: 10 mg via INTRAMUSCULAR
  Filled 2016-08-30: qty 1

## 2016-08-30 MED ORDER — OXYCODONE-ACETAMINOPHEN 5-325 MG PO TABS: 2.0000 | ORAL_TABLET | ORAL | 0 refills | Status: DC | PRN

## 2016-08-30 MED ORDER — HYDROCODONE-ACETAMINOPHEN 5-325 MG PO TABS
2.0000 | ORAL_TABLET | Freq: Once | ORAL | Status: AC
  Administered 2016-08-30: 2 via ORAL
  Filled 2016-08-30: qty 2

## 2016-08-30 MED ORDER — METHOCARBAMOL 500 MG PO TABS: 500.0000 mg | ORAL_TABLET | Freq: Two times a day (BID) | ORAL | 0 refills | Status: DC

## 2016-08-30 MED ORDER — IBUPROFEN 200 MG PO TABS
600.0000 mg | ORAL_TABLET | Freq: Once | ORAL | Status: AC
  Administered 2016-08-30: 600 mg via ORAL
  Filled 2016-08-30: qty 3

## 2016-08-30 MED ORDER — PREDNISONE 10 MG PO TABS: 40.0000 mg | ORAL_TABLET | Freq: Every day | ORAL | 0 refills | Status: AC

## 2016-08-30 NOTE — ED Provider Notes (Signed)
[facility]-EMERGENCY DEPT Provider Note     By signing my name below, I, Earmon PhoenixJennifer Waddell, attest that this documentation has been prepared under the direction and in the presence of Sharen Hecklaudia Gibbons, PA-C. Electronically Signed: Earmon PhoenixJennifer Waddell, ED Scribe. 08/30/16. 1:04 PM.    History   Chief Complaint Chief Complaint  Patient presents with  . Back Pain    The history is provided by the patient and medical records. No language interpreter was used.    Veronica RankKaren R Peters is a 46 y.o. female who presents to the Emergency Department complaining of sharp centralized low back pain that began earlier this morning. She states she was bending over putting her pant son this morning when she felt her "back caught" causing immediate pain in her middle low back with radiation to lateral left upper thigh.   She has not taken anything for pain relief. Turning, sitting, standing or laughing increases her pain. Pt denies alleviating factors. She denies saddle anesthesia, bowel or bladder incontinence, or new numbness, tingling or weakness of the lower extremities. She does report mechanic fall on the ice last week, where she landed on her buttocks, pt states pain eventually resolved. Pt states she has been having intermittent lower extremity pain and tingling for the past two years and has been evaluated by her PCP, Dr. Clelia CroftShaw for this. Pt was seen by an orthopedist several years ago because of a fall and had negative imaging. She denies back injuries or surgeries.  No fevers. No abdominal pain or urinary symptoms.   History reviewed. No pertinent past medical history.  There are no active problems to display for this patient.   Past Surgical History:  Procedure Laterality Date  . BREAST ENHANCEMENT SURGERY    . EYE SURGERY    . TUBAL LIGATION    . VESICOVAGINAL FISTULA CLOSURE W/ TAH      OB History    No data available       Home Medications    Prior to Admission medications   Medication Sig  Start Date End Date Taking? Authorizing Provider  amoxicillin-clavulanate (AUGMENTIN) 875-125 MG tablet Take 1 tablet by mouth 2 (two) times daily. Patient not taking: Reported on 08/06/2016 12/03/15   Sherren MochaEva N Shaw, MD  benzonatate (TESSALON) 200 MG capsule Take 1 capsule (200 mg total) by mouth 3 (three) times daily as needed for cough. Patient not taking: Reported on 08/06/2016 12/03/15   Sherren MochaEva N Shaw, MD  buPROPion (WELLBUTRIN XL) 300 MG 24 hr tablet Take 300 mg by mouth daily.    Historical Provider, MD  Dextromethorphan-Guaifenesin (MUCINEX DM MAXIMUM STRENGTH) 60-1200 MG TB12 Take 1 tablet by mouth every 12 (twelve) hours. Patient not taking: Reported on 08/06/2016 12/03/15   Sherren MochaEva N Shaw, MD  guaiFENesin (MUCINEX) 600 MG 12 hr tablet Take 1,200 mg by mouth 2 (two) times daily. Reported on 12/03/2015    Historical Provider, MD  ipratropium (ATROVENT) 0.03 % nasal spray Place 2 sprays into both nostrils 2 (two) times daily. 08/06/16   Doyle AskewKimberly Stephenia Harris, FNP  loratadine (CLARITIN) 10 MG tablet Take 10 mg by mouth daily. Reported on 12/03/2015    Historical Provider, MD  LORazepam (ATIVAN) 0.5 MG tablet Take 0.5 mg by mouth every 8 (eight) hours.    Historical Provider, MD  methocarbamol (ROBAXIN) 500 MG tablet Take 1 tablet (500 mg total) by mouth 2 (two) times daily. 08/30/16   Liberty Handylaudia J Gibbons, PA-C  naproxen (NAPROSYN) 500 MG tablet Take 1 tablet (500 mg  total) by mouth 2 (two) times daily. 08/30/16   Liberty Handy, PA-C  oxyCODONE-acetaminophen (PERCOCET/ROXICET) 5-325 MG tablet Take 2 tablets by mouth every 4 (four) hours as needed for severe pain. FOR SEVERE LOW BACK PAIN 08/30/16   Liberty Handy, PA-C  predniSONE (DELTASONE) 10 MG tablet Take 4 tablets (40 mg total) by mouth daily. 08/30/16 09/04/16  Liberty Handy, PA-C  sertraline (ZOLOFT) 100 MG tablet Take 200 mg by mouth daily. Reported on 12/03/2015    Historical Provider, MD  simvastatin (ZOCOR) 40 MG tablet Take 40 mg by mouth every evening.  Reported on 12/03/2015    Historical Provider, MD    Family History History reviewed. No pertinent family history.  Social History Social History  Substance Use Topics  . Smoking status: Never Smoker  . Smokeless tobacco: Never Used  . Alcohol use No     Allergies   Sulfa antibiotics   Review of Systems Review of Systems  Constitutional: Negative for fever.  HENT: Negative for congestion and sore throat.   Eyes: Negative for visual disturbance.  Respiratory: Negative for cough and shortness of breath.   Cardiovascular: Negative for chest pain.  Gastrointestinal: Negative for abdominal pain, constipation, diarrhea, nausea and vomiting.       No bowel or bladder incontinence  Genitourinary: Negative for difficulty urinating.  Musculoskeletal: Positive for back pain and gait problem. Negative for arthralgias.  Skin: Negative for color change and wound.  Neurological: Negative for dizziness, weakness, light-headedness, numbness and headaches.     Physical Exam Updated Vital Signs BP 140/86 (BP Location: Right Arm)   Pulse 70   Temp 97.6 F (36.4 C) (Oral)   Resp 18   Ht 5\' 5"  (1.651 m)   Wt 175 lb (79.4 kg)   SpO2 100%   BMI 29.12 kg/m   Physical Exam  Constitutional: She is oriented to person, place, and time. She appears well-developed and well-nourished. No distress.  NAD.  HENT:  Head: Normocephalic and atraumatic.  Right Ear: External ear normal.  Left Ear: External ear normal.  Nose: Nose normal.  Mouth/Throat: Oropharynx is clear and moist. No oropharyngeal exudate.  Moist mucous membranes.  No nasal mucosa edema. No nasal discharge noted. Oropharynx and tonsils normal without edema, erythema, exudates or lesions.  Uvula midline. No trismus.   Eyes: Conjunctivae and EOM are normal. Pupils are equal, round, and reactive to light. No scleral icterus.  Neck: Normal range of motion. Neck supple. No JVD present.  Cardiovascular: Normal rate, regular rhythm  and normal heart sounds.   No murmur heard. Pulmonary/Chest: Effort normal and breath sounds normal. She has no wheezes.  Abdominal:  No surgical abdominal scars noted. No pulsating masses.  + Bowel sounds throughout.  Abdomen is soft, non tender without distention, rigidity, guarding or rebound.  No suprapubic tenderness. No CVAT.  Negative Murphy's. Negative McBurney's. Negative Psoas sign.  Non palpable kidneys. No hepatosplenomegaly.   Musculoskeletal: Normal range of motion. She exhibits tenderness. She exhibits no deformity.  Gait slow but normal.  Full active CTL spine ROM with reported central low pain with lumbar spine flexion, left lateral bend and left rotation.  Positive left SLR. Positive Faber on left side. Negative Stinchfield test. Left SI joint tenderness. Subjective pain with internal/external rotation and flexion of the left hip. Full passive hip, knee and ankle ROM bilaterally.  No CTL tenderness. No CTL paraspinal tenderness or increased tone.  Lymphadenopathy:    She has no cervical adenopathy.  Neurological: She is alert and oriented to person, place, and time.  No groin numbness. Gait normal. No foot drop. Negative Romberg's. 5/5 strength with hip flexion and extension, bilaterally.  5/5 strength with knee flexion and extension, bilaterally.  5/5 strength with ankle dorsiflexion and plantar flexion, bilaterally.  Sensation to light touch intact in the distribution of the obturator nerve, lateral cutaneous nerve, femoral nerve, common fibular nerve.  2/4 knee DTR bilaterally.  Foot: sensation to light touch intact in the distribution of the saphenous nerve, medial plantar nerve, lateral plantar nerve, bilaterally.  Skin: Skin is warm and dry. Capillary refill takes less than 2 seconds.  Psychiatric: She has a normal mood and affect. Her behavior is normal. Judgment and thought content normal.  Nursing note and vitals reviewed.    ED Treatments / Results    DIAGNOSTIC STUDIES: Oxygen Saturation is 100% on RA, normal by my interpretation.   COORDINATION OF CARE: 12:41 PM- Will CT lumbar spine. Will prescribe steroid injection and pain medication prior to discharge. Advised pt to follow up with PCP. Pt verbalizes understanding and agrees to plan.  Medications  HYDROcodone-acetaminophen (NORCO/VICODIN) 5-325 MG per tablet 2 tablet (not administered)  ibuprofen (ADVIL,MOTRIN) tablet 600 mg (not administered)  dexamethasone (DECADRON) injection 10 mg (10 mg Intramuscular Given 08/30/16 1312)    Labs (all labs ordered are listed, but only abnormal results are displayed) Labs Reviewed - No data to display  EKG  EKG Interpretation None       Radiology Ct Lumbar Spine Wo Contrast  Result Date: 08/30/2016 CLINICAL DATA:  46 y/o F; sharp mid to lower back pain radiating down the left leg. EXAM: CT LUMBAR SPINE WITHOUT CONTRAST TECHNIQUE: Multidetector CT imaging of the lumbar spine was performed without intravenous contrast administration. Multiplanar CT image reconstructions were also generated. COMPARISON:  02/02/2007 lumbar MRI. FINDINGS: Segmentation: 6 lumbar type non-rib-bearing vertebral bodies are present. Alignment: Mild levocurvature of the lumbar spine. Normal lumbar lordosis. No listhesis. Articulation of the lowest vertebral body left transverse process with the left sacral ala. Vertebrae: No acute fracture or focal pathologic process. Paraspinal and other soft tissues: Minimal aortic atherosclerosis with calcification. Disc levels: There are mild discogenic degenerative and facet degenerative changes of the lumbar spine with small marginal osteophytes. No significant loss of disc space height. Vertebral body heights are preserved. IMPRESSION: 1. No acute fracture or dislocation of the lumbar spine. 2. Mild lumbar spondylosis with discogenic and facet degenerative changes. Mild lumbar levocurvature. Disc herniation is best assessed with  MRI. 3. Minimal aortic atherosclerosis. Electronically Signed   By: Mitzi Hansen M.D.   On: 08/30/2016 13:56    Procedures Procedures (including critical care time)  Medications Ordered in ED Medications  HYDROcodone-acetaminophen (NORCO/VICODIN) 5-325 MG per tablet 2 tablet (not administered)  ibuprofen (ADVIL,MOTRIN) tablet 600 mg (not administered)  dexamethasone (DECADRON) injection 10 mg (10 mg Intramuscular Given 08/30/16 1312)     Initial Impression / Assessment and Plan / ED Course  I have reviewed the triage vital signs and the nursing notes.  Pertinent labs & imaging results that were available during my care of the patient were reviewed by me and considered in my medical decision making (see chart for details).     Patient is a 46 yr female with pertinent pmh of tubal ligation and TAH who presents to the ED with acute, sharp central low back pain radiating to left lateral upper thigh that started this morning while she was bending over putting on  her pants. Pt also reports mechanical fall on ice last week with direct landing on buttocks.  No previous back injuries or surgeries. No abdominal pain or fevers.   On exam pt has VSS, abdominal exam negative without guarding, rebound or rigidity.  Negative Murphy's and McBurney's.  No suprapubic or CVAT.  Musculoskeletal exam revealed subjective pain with L hip flexion and internal/external rotation with reported pain in L lower back.  +SLR and + Fabers on L.  Negative Stintchfield bilaterally.  There was L SI joint tenderness and L sciatic notch tenderness.  Normal gait and posture.  No neurological deficits appreciated. Patient is ambulatory. Initial ddx include lumbar strain, psoas muscle strain or spasm or sciatic nerve compression due to recent fall.  I considered ruptured disc, UTI/pyelo, PID, kidney stone, cauda equina or epidural abscess.  No red flag symptoms of back pain including: fecal incontinence, urinary  retention or overflow incontinence, night sweats, waking from sleep with back pain, unexplained fevers or weight loss, h/o cancer, IVDU, recent trauma. No concern for cauda equina, epidural abscess, or other serious cause of back pain at this time.  Given female sex, age and recent fall will obtain CT scan to r/o compression fx or other acute injuries causing compression.  CT scan shows spondylosis of lumbar spine with mild disc space narrowing and no acute injuries or nerve compression.  Suspect pt experiencing psoas muscle spasm or strain.  Conservative measures such as ice/heat, mild stretches, muscle relaxant and ibuprofen indicated with PCP follow-up if no improvement with conservative management. Pt given prednisone and short course of percocet for acute pain. ED return precautions discussed with pt who verbalized understanding and was agreeable to dispo plan.   I personally performed the services described in this documentation, which was scribed in my presence. The recorded information has been reviewed and is accurate.  Final Clinical Impressions(s) / ED Diagnoses   Final diagnoses:  Acute midline low back pain with left-sided sciatica    New Prescriptions New Prescriptions   METHOCARBAMOL (ROBAXIN) 500 MG TABLET    Take 1 tablet (500 mg total) by mouth 2 (two) times daily.   NAPROXEN (NAPROSYN) 500 MG TABLET    Take 1 tablet (500 mg total) by mouth 2 (two) times daily.   OXYCODONE-ACETAMINOPHEN (PERCOCET/ROXICET) 5-325 MG TABLET    Take 2 tablets by mouth every 4 (four) hours as needed for severe pain. FOR SEVERE LOW BACK PAIN   PREDNISONE (DELTASONE) 10 MG TABLET    Take 4 tablets (40 mg total) by mouth daily.     Liberty Handy, PA-C 08/30/16 1415    Mancel Bale, MD 08/30/16 [phone]

## 2016-08-30 NOTE — Discharge Instructions (Signed)
As discussed your CT scan shows some arthritic changes in your lower back but nothing acute from your recent fall that could be compressing on your nerves.   You have been prescribed Robaxin which is a muscle relaxer that may help with muscle spasms and muscle tightening in your lower back, prednisone to decrease the acute inflammation in your lower back, naproxen which is an anti-inflammatory and anti-pain medication as well as a short course of percocet for severe low back pain. Please take these medications as prescribed.   Please read the attached information on sciatica and back pain.   Please schedule an appointment with your primary care provider next week for reevaluation and ensure the your pain is under well control and is improving. Your primary care provider may suggest physical therapy or specialist referral.

## 2016-08-30 NOTE — ED Triage Notes (Addendum)
Pt states that she was changing her clothes and suddenly started experiencing a sharp mid-lower back pain that radiates down her L leg. Pt is ambulatory. A&Ox4. Denies N/V/Urinary symptoms.

## 2016-11-17 DIAGNOSIS — M25531 Pain in right wrist: Secondary | ICD-10-CM | POA: Diagnosis not present

## 2017-07-20 IMAGING — CT CT L SPINE W/O CM
3 of 6 series · 14 of 33 positions shown, 16 images · non-contrast
Comparison: 02/02/2007 lumbar MRI.

CLINICAL DATA: 45 y/o F; sharp mid to lower back pain radiating
down the left leg.

EXAM:
CT LUMBAR SPINE WITHOUT CONTRAST
TECHNIQUE: Multidetector CT imaging of the lumbar spine was performed without
intravenous contrast administration. Multiplanar CT image
reconstructions were also generated.

[Series 4: l-spine · axial · 0.30mm/px · z∈[+1396,+1570]mm · 6 of 117 slices shown, 8 images]
[im 15/117  soft-tissue]
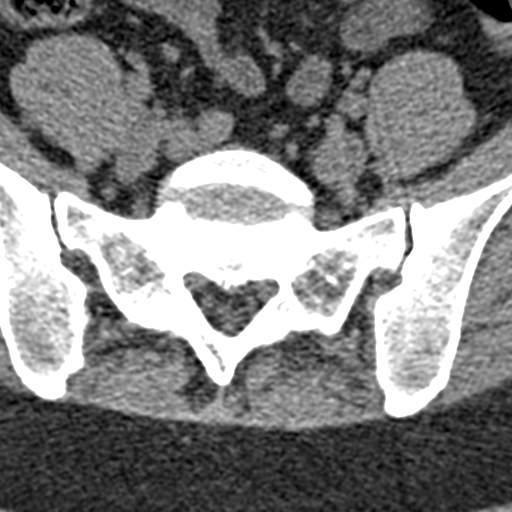
[im 15/117  bone]
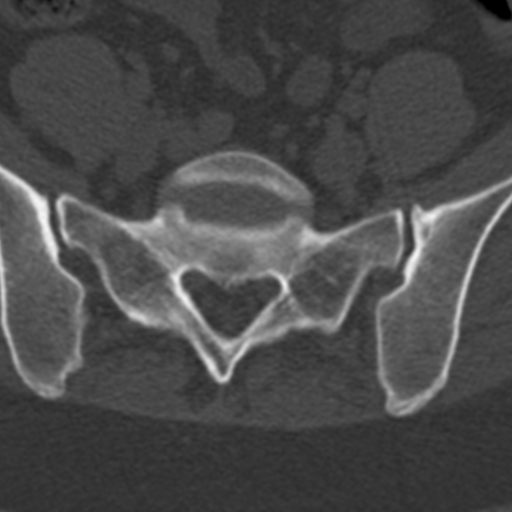
[im 30/117  bone]
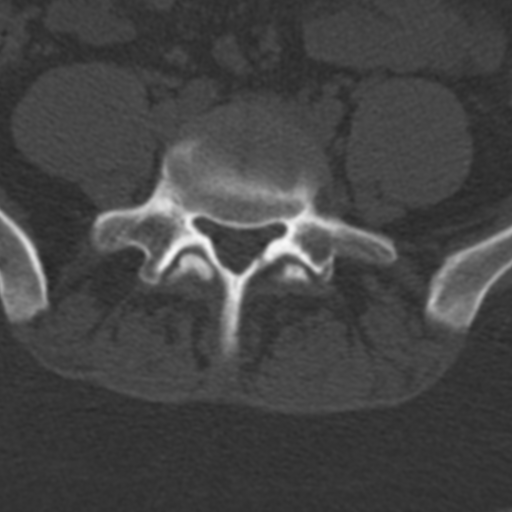
[im 44/117  bone]
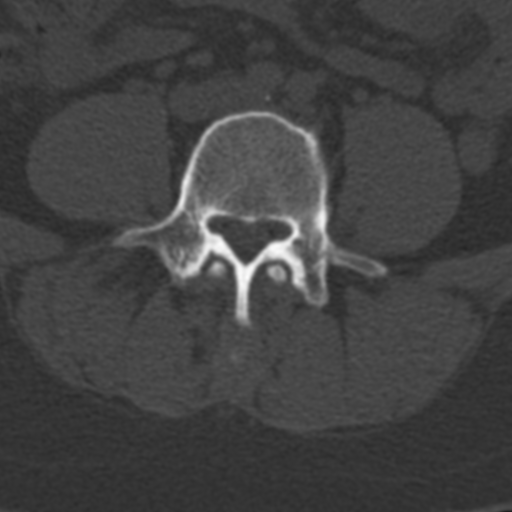
[im 73/117  bone]
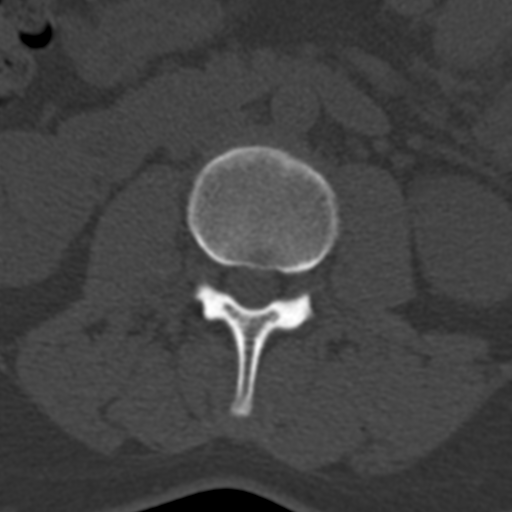
[im 88/117  soft-tissue]
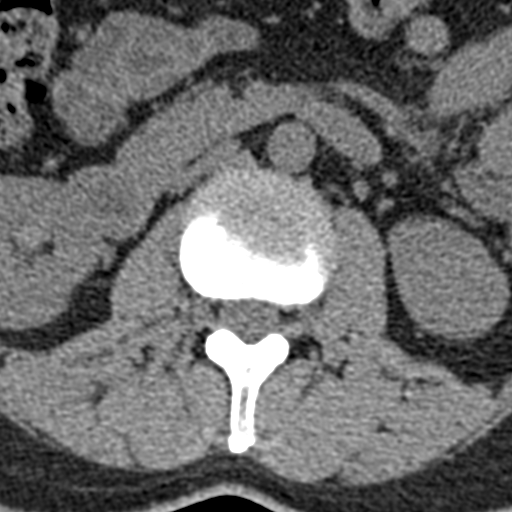
[im 88/117  bone]
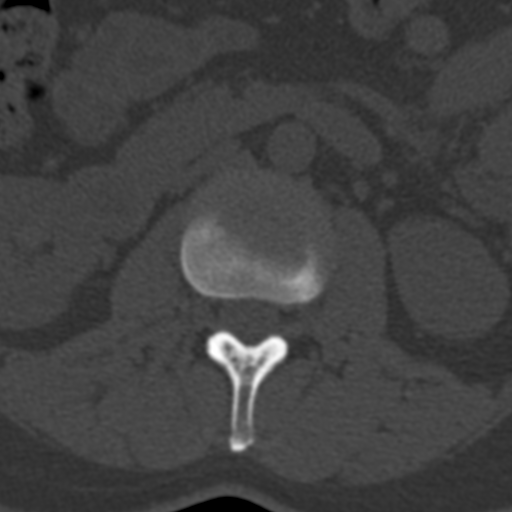
[im 102/117  bone]
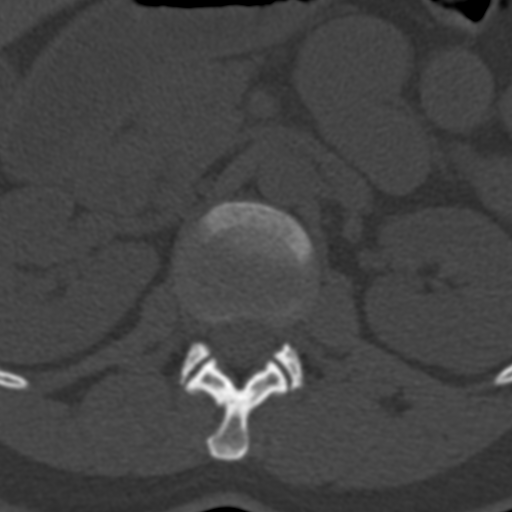

[Series 6: coronal · coronal · 0.30mm/px · 3 of 61 slices shown]
[im 13/61  bone]
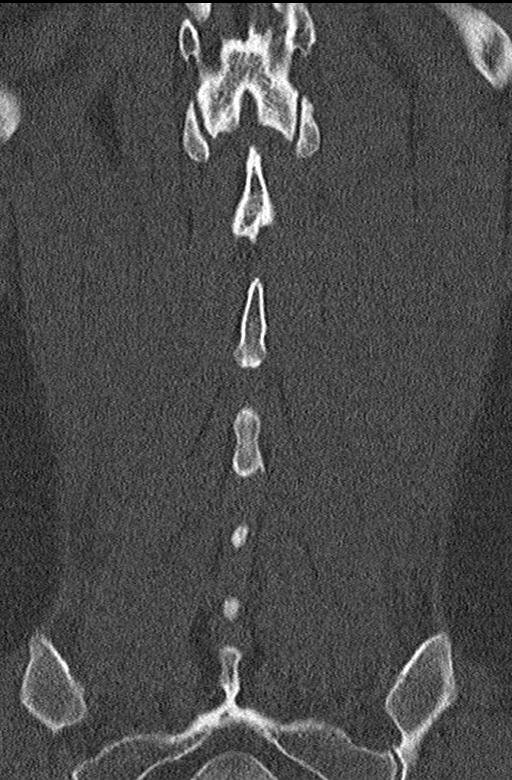
[im 25/61  bone]
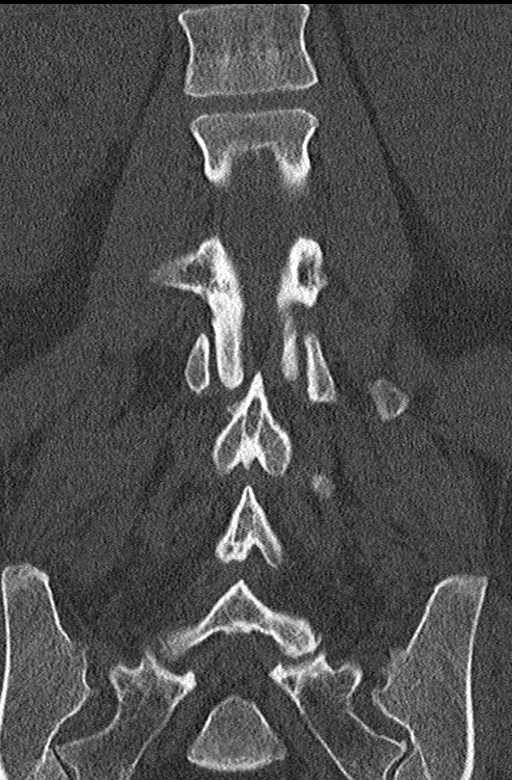
[im 37/61  bone]
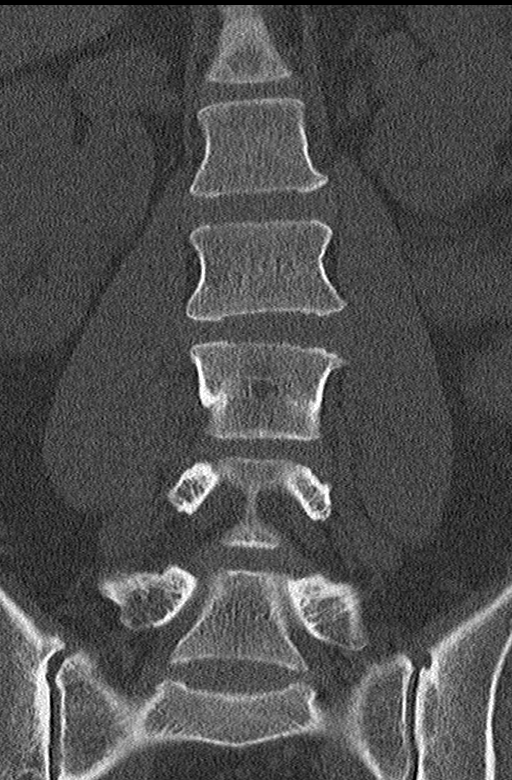

[Series 7: sagittal · sagittal · 0.34mm/px · 5 of 61 slices shown]
[im 11/61  bone]
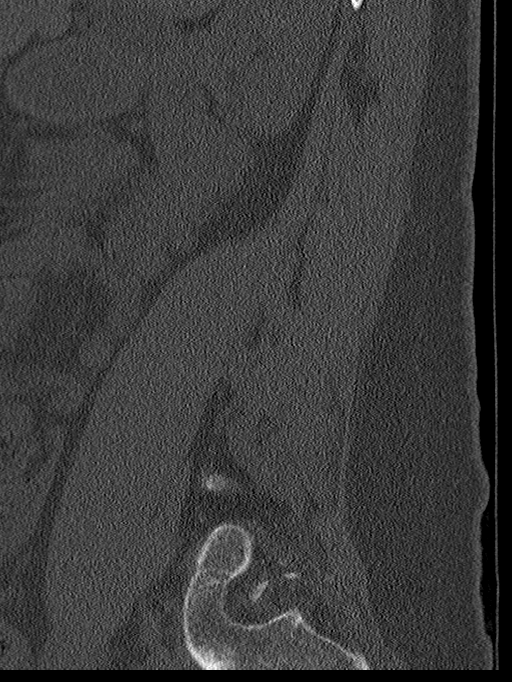
[im 21/61  bone]
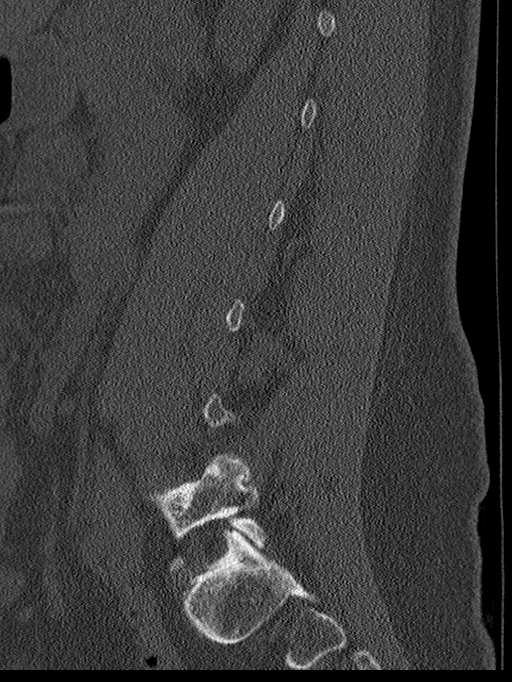
[im 31/61  bone]
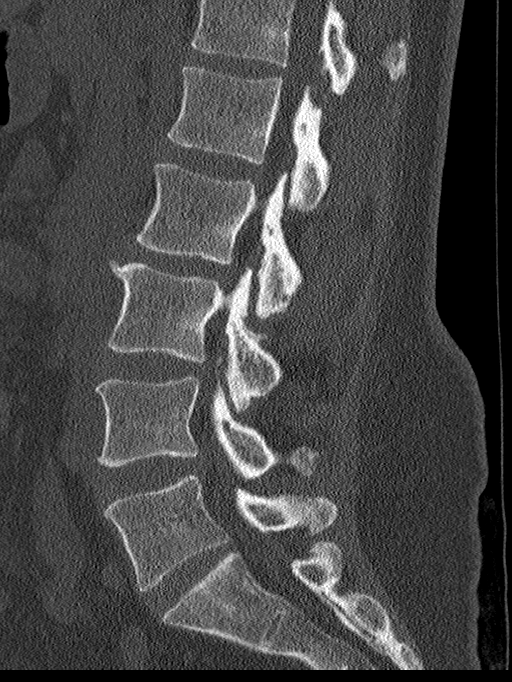
[im 41/61  bone]
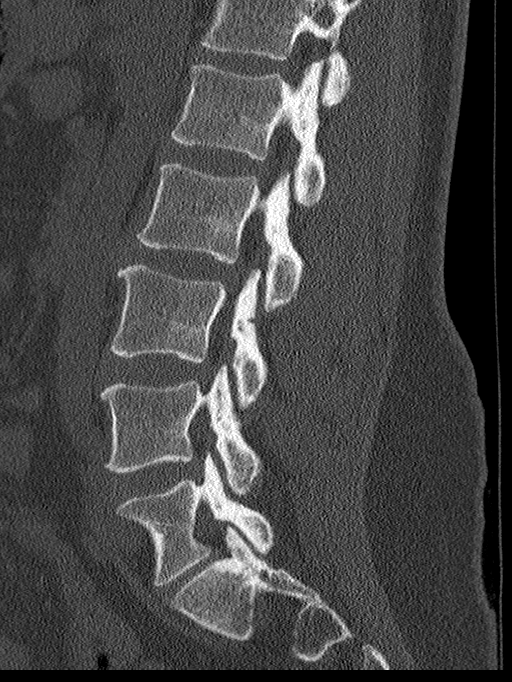
[im 51/61  bone]
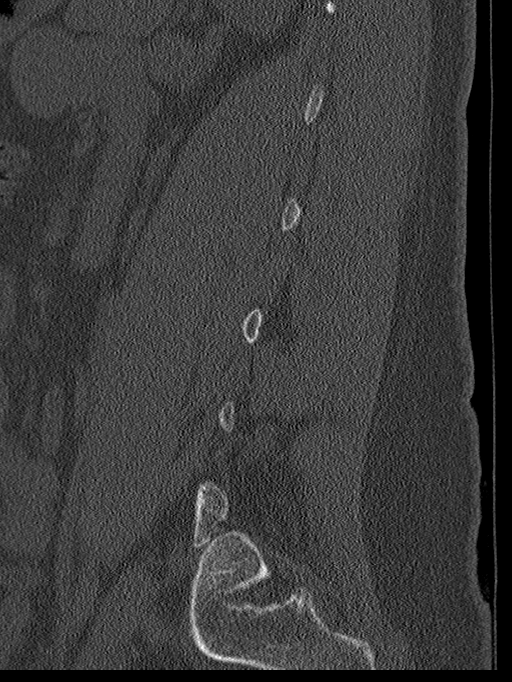

[14 of 33 positions shown; findings below may reference images not displayed]

FINDINGS: Segmentation: 6 lumbar type non-rib-bearing vertebral bodies are
present.

Alignment: Mild levocurvature of the lumbar spine. Normal lumbar
lordosis. No listhesis. Articulation of the lowest vertebral body
left transverse process with the left sacral ala.

Vertebrae: No acute fracture or focal pathologic process.

Paraspinal and other soft tissues: Minimal aortic atherosclerosis
with calcification.

Disc levels: There are mild discogenic degenerative and facet
degenerative changes of the lumbar spine with small marginal
osteophytes. No significant loss of disc space height. Vertebral
body heights are preserved.
IMPRESSION: 1. No acute fracture or dislocation of the lumbar spine.
2. Mild lumbar spondylosis with discogenic and facet degenerative
changes. Mild lumbar levocurvature. Disc herniation is best assessed
with MRI.
3. Minimal aortic atherosclerosis.

By: Edi-Blendi Parovel M.D.

## 2017-07-23 ENCOUNTER — Other Ambulatory Visit: Payer: Self-pay | Admitting: Family Medicine

## 2017-07-23 DIAGNOSIS — Z0000 Encounter for general adult medical examination without abnormal findings: Secondary | ICD-10-CM | POA: Diagnosis not present

## 2017-07-23 DIAGNOSIS — Z1231 Encounter for screening mammogram for malignant neoplasm of breast: Secondary | ICD-10-CM

## 2017-07-23 DIAGNOSIS — E782 Mixed hyperlipidemia: Secondary | ICD-10-CM | POA: Diagnosis not present

## 2017-07-23 DIAGNOSIS — Z23 Encounter for immunization: Secondary | ICD-10-CM | POA: Diagnosis not present

## 2017-08-18 ENCOUNTER — Ambulatory Visit
Admission: RE | Admit: 2017-08-18 | Discharge: 2017-08-18 | Disposition: A | Discharge status: Home / Self Care | Payer: 59 | Source: Ambulatory Visit | Attending: Family Medicine | Admitting: Family Medicine

## 2017-08-18 DIAGNOSIS — Z1231 Encounter for screening mammogram for malignant neoplasm of breast: Secondary | ICD-10-CM | POA: Diagnosis not present

## 2017-12-09 DIAGNOSIS — L578 Other skin changes due to chronic exposure to nonionizing radiation: Secondary | ICD-10-CM | POA: Diagnosis not present

## 2017-12-09 DIAGNOSIS — Z86018 Personal history of other benign neoplasm: Secondary | ICD-10-CM | POA: Diagnosis not present

## 2017-12-09 DIAGNOSIS — L738 Other specified follicular disorders: Secondary | ICD-10-CM | POA: Diagnosis not present

## 2018-02-07 DIAGNOSIS — J029 Acute pharyngitis, unspecified: Secondary | ICD-10-CM | POA: Diagnosis not present

## 2018-08-18 DIAGNOSIS — Z0000 Encounter for general adult medical examination without abnormal findings: Secondary | ICD-10-CM | POA: Diagnosis not present

## 2018-08-18 DIAGNOSIS — R6889 Other general symptoms and signs: Secondary | ICD-10-CM | POA: Diagnosis not present

## 2018-08-18 DIAGNOSIS — E782 Mixed hyperlipidemia: Secondary | ICD-10-CM | POA: Diagnosis not present

## 2018-08-30 DIAGNOSIS — M546 Pain in thoracic spine: Secondary | ICD-10-CM | POA: Diagnosis not present

## 2018-08-30 DIAGNOSIS — M545 Low back pain: Secondary | ICD-10-CM | POA: Diagnosis not present

## 2018-09-07 DIAGNOSIS — M546 Pain in thoracic spine: Secondary | ICD-10-CM | POA: Diagnosis not present

## 2018-09-07 DIAGNOSIS — M545 Low back pain: Secondary | ICD-10-CM | POA: Diagnosis not present

## 2018-09-15 DIAGNOSIS — M545 Low back pain: Secondary | ICD-10-CM | POA: Diagnosis not present

## 2018-09-15 DIAGNOSIS — M546 Pain in thoracic spine: Secondary | ICD-10-CM | POA: Diagnosis not present

## 2019-09-12 ENCOUNTER — Other Ambulatory Visit: Payer: Self-pay | Admitting: Family Medicine

## 2019-09-12 DIAGNOSIS — Z1231 Encounter for screening mammogram for malignant neoplasm of breast: Secondary | ICD-10-CM

## 2019-09-14 ENCOUNTER — Other Ambulatory Visit: Payer: Self-pay | Admitting: Obstetrics & Gynecology

## 2019-10-11 ENCOUNTER — Other Ambulatory Visit: Payer: Self-pay

## 2019-10-11 ENCOUNTER — Ambulatory Visit
Admission: RE | Admit: 2019-10-11 | Discharge: 2019-10-11 | Disposition: A | Discharge status: Home / Self Care | Payer: 59 | Source: Ambulatory Visit | Attending: Family Medicine | Admitting: Family Medicine

## 2019-10-11 DIAGNOSIS — Z1231 Encounter for screening mammogram for malignant neoplasm of breast: Secondary | ICD-10-CM

## 2020-09-23 ENCOUNTER — Other Ambulatory Visit: Payer: Self-pay | Admitting: Family Medicine

## 2020-09-23 DIAGNOSIS — Z1231 Encounter for screening mammogram for malignant neoplasm of breast: Secondary | ICD-10-CM

## 2020-11-14 ENCOUNTER — Ambulatory Visit
Admission: RE | Admit: 2020-11-14 | Discharge: 2020-11-14 | Disposition: A | Discharge status: Home / Self Care | Payer: 59 | Source: Ambulatory Visit | Attending: Family Medicine | Admitting: Family Medicine

## 2020-11-14 ENCOUNTER — Other Ambulatory Visit: Payer: Self-pay

## 2020-11-14 DIAGNOSIS — Z1231 Encounter for screening mammogram for malignant neoplasm of breast: Secondary | ICD-10-CM

## 2021-09-14 ENCOUNTER — Emergency Department (HOSPITAL_COMMUNITY): Payer: 59

## 2021-09-14 ENCOUNTER — Other Ambulatory Visit: Payer: Self-pay

## 2021-09-14 ENCOUNTER — Encounter (HOSPITAL_COMMUNITY): Payer: Self-pay

## 2021-09-14 ENCOUNTER — Emergency Department (HOSPITAL_COMMUNITY)
Admission: EM | Admit: 2021-09-14 | Discharge: 2021-09-14 | Disposition: A | Discharge status: Home / Self Care | Payer: 59 | Attending: Emergency Medicine | Admitting: Emergency Medicine

## 2021-09-14 DIAGNOSIS — W010XXA Fall on same level from slipping, tripping and stumbling without subsequent striking against object, initial encounter: Secondary | ICD-10-CM | POA: Insufficient documentation

## 2021-09-14 DIAGNOSIS — S8265XA Nondisplaced fracture of lateral malleolus of left fibula, initial encounter for closed fracture: Secondary | ICD-10-CM | POA: Insufficient documentation

## 2021-09-14 DIAGNOSIS — Y92009 Unspecified place in unspecified non-institutional (private) residence as the place of occurrence of the external cause: Secondary | ICD-10-CM | POA: Diagnosis not present

## 2021-09-14 DIAGNOSIS — S99912A Unspecified injury of left ankle, initial encounter: Secondary | ICD-10-CM | POA: Diagnosis present

## 2021-09-14 MED ORDER — HYDROCODONE-ACETAMINOPHEN 5-325 MG PO TABS
2.0000 | ORAL_TABLET | Freq: Once | ORAL | Status: AC
  Administered 2021-09-14: 2 via ORAL
  Filled 2021-09-14: qty 2

## 2021-09-14 MED ORDER — HYDROCODONE-ACETAMINOPHEN 5-325 MG PO TABS: 1.0000 | ORAL_TABLET | Freq: Four times a day (QID) | ORAL | 0 refills | Status: DC | PRN

## 2021-09-14 MED ORDER — NAPROXEN 500 MG PO TABS: 500.0000 mg | ORAL_TABLET | Freq: Two times a day (BID) | ORAL | 0 refills | Status: DC

## 2021-09-14 NOTE — ED Notes (Signed)
Dr Hyacinth Meeker assessed pt prior to triage. Dr. Hyacinth Meeker applied splint.

## 2021-09-14 NOTE — ED Triage Notes (Signed)
Pt fell at home and left ankle is swollen.

## 2021-09-14 NOTE — ED Provider Notes (Signed)
[facility] EMERGENCY DEPARTMENT Provider Note   CSN: [id number] Arrival date & time: 09/14/21  1517     History  No chief complaint on file.   Veronica Peters is a 51 y.o. female.  HPI  This patient is a 51 year old female, she presents to the hospital after having an accidental slip and fall that occurred at home when she was walking on the wet ground.  When she fell to the ground and slipped her left ankle twisted, she felt a pop and then was unable to stand.  She had to crawl herself into the house, they called for 911 who transported her to the hospital.  There is obvious mild deformity of the left ankle.  No other injuries.  Home Medications Prior to Admission medications   Medication Sig Start Date End Date Taking? Authorizing Provider  HYDROcodone-acetaminophen (NORCO/VICODIN) 5-325 MG tablet Take 1 tablet by mouth every 6 (six) hours as needed. 09/14/21  Yes Eber Hong, MD  naproxen (NAPROSYN) 500 MG tablet Take 1 tablet (500 mg total) by mouth 2 (two) times daily with a meal. 09/14/21  Yes Eber Hong, MD  buPROPion (WELLBUTRIN XL) 300 MG 24 hr tablet Take 300 mg by mouth daily.    [provider]  guaiFENesin (MUCINEX) 600 MG 12 hr tablet Take 1,200 mg by mouth 2 (two) times daily. Reported on 12/03/2015    [provider]  ipratropium (ATROVENT) 0.03 % nasal spray Place 2 sprays into both nostrils 2 (two) times daily. 08/06/16   Bing Neighbors, FNP  loratadine (CLARITIN) 10 MG tablet Take 10 mg by mouth daily. Reported on 12/03/2015    [provider]  LORazepam (ATIVAN) 0.5 MG tablet Take 0.5 mg by mouth every 8 (eight) hours.    [provider]  methocarbamol (ROBAXIN) 500 MG tablet Take 1 tablet (500 mg total) by mouth 2 (two) times daily. 08/30/16   Liberty Handy, PA-C  sertraline (ZOLOFT) 100 MG tablet Take 200 mg by mouth daily. Reported on 12/03/2015    [provider]  simvastatin (ZOCOR) 40 MG tablet Take 40 mg by  mouth every evening. Reported on 12/03/2015    [provider]      Allergies    Sulfa antibiotics    Review of Systems   Review of Systems  Musculoskeletal:  Positive for arthralgias.  Neurological:  Negative for weakness and numbness.   Physical Exam Updated Vital Signs There were no vitals taken for this visit. Physical Exam Vitals and nursing note reviewed.  Constitutional:      Appearance: She is well-developed. She is not diaphoretic.  HENT:     Head: Normocephalic and atraumatic.  Eyes:     General:        Right eye: No discharge.        Left eye: No discharge.     Conjunctiva/sclera: Conjunctivae normal.  Pulmonary:     Effort: Pulmonary effort is normal. No respiratory distress.  Musculoskeletal:        General: Swelling, tenderness and signs of injury present. No deformity.     Right lower leg: No edema.     Left lower leg: No edema.     Comments: Left ankle with tenderness over the left lateral malleolus, there is normal pulses at the feet bilaterally, there is mild swelling of the ankle on the left, normal right lower extremity, normal left knee, no tenderness over the proximal fibula.  Skin:    General: Skin is  warm and dry.     Findings: No erythema or rash.  Neurological:     Mental Status: She is alert.     Coordination: Coordination normal.     Comments: Normal sensation of the left foot    ED Results / Procedures / Treatments   Labs (all labs ordered are listed, but only abnormal results are displayed) Labs Reviewed - No data to display  EKG None  Radiology DG Ankle Complete Left  Result Date: 09/14/2021 CLINICAL DATA:  Trauma, fall EXAM: LEFT ANKLE COMPLETE - 3+ VIEW COMPARISON:  None. FINDINGS: Undisplaced spiral fracture is seen in the distal shaft and distal metaphysis of left fibula. There is soft tissue swelling over the lateral malleolus. Ankle mortise is well-maintained. IMPRESSION: Undisplaced fracture is seen in the distal left  fibula. Electronically Signed   By: Ernie Avena M.D.   On: 09/14/2021 15:49    Procedures .Splint Application  Date/Time: 09/14/2021 3:56 PM Performed by: Eber Hong, MD Authorized by: Eber Hong, MD   Consent:    Consent obtained:  Verbal   Consent given by:  Patient   Risks, benefits, and alternatives were discussed: yes     Risks discussed:  Discoloration, swelling, numbness and pain   Alternatives discussed:  No treatment and alternative treatment Universal protocol:    Procedure explained and questions answered to patient or proxy's satisfaction: yes     Imaging studies available: yes     Required blood products, implants, devices, and special equipment available: yes     Site/side marked: yes     Immediately prior to procedure a time out was called: yes     Patient identity confirmed:  Verbally with patient Pre-procedure details:    Distal neurologic exam:  Normal   Distal perfusion: distal pulses strong and brisk capillary refill   Procedure details:    Location:  Leg   Leg location:  L lower leg   Splint type:  Short leg   Supplies:  Cotton padding, fiberglass and elastic bandage   Attestation: Splint applied and adjusted personally by me   Post-procedure details:    Distal neurologic exam:  Normal   Distal perfusion: distal pulses strong, brisk capillary refill and unchanged     Procedure completion:  Tolerated   Post-procedure imaging: not applicable   Comments:     Pt tolerated well Stirrup and posterior splint Crutches given     Medications Ordered in ED Medications  HYDROcodone-acetaminophen (NORCO/VICODIN) 5-325 MG per tablet 2 tablet (has no administration in time range)    ED Course/ Medical Decision Making/ A&P                           Medical Decision Making Amount and/or Complexity of Data Reviewed Radiology: ordered.  Risk Prescription drug management.   This patient presents to the ED for concern of ankle injury,  differential diagnosis includes possibly sprain of the ankle, possibly fracture of the lateral malleolus or even a bimalleolar fracture.   Co morbidities that complicate the patient evaluation  No anticoagulants   Additional history obtained:  Additional history obtained from paramedics No recent medical records in the hospital, she has been followed by her family doctor over time for different and various illnesses    Imaging Studies ordered:  I ordered imaging studies including left ankle x-ray I independently visualized and interpreted imaging which showed fracture I agree with the radiologist interpretation   Cardiac Monitoring:  The patient was maintained on a cardiac monitor.  I personally viewed and interpreted the cardiac monitored which showed an underlying rhythm of: Normal sinus rhythm   Medicines ordered and prescription drug management:  I ordered medication including hydrocodone for possible fracture Reevaluation of the patient after these medicines showed that the patient improved I have reviewed the patients home medicines and have made adjustments as needed   Test Considered:  CT scan of the ankle but does not seem necessary at this time   Critical Interventions:  Evaluation of possible fracture   Problem List / ED Course:  Patient informed of nondisplaced fracture, I personally placed the splint, see the procedure note, patient referred to orthopedics as an outpatient   Reevaluation:  After the interventions noted above, I reevaluated the patient and found that they have : Improved   Social Determinants of Health:  None   Dispostion:  After consideration of the diagnostic results and the patients response to treatment, I feel that the patent would benefit from discharge home with close follow-up, patient agreeable.          Final Clinical Impression(s) / ED Diagnoses Final diagnoses:  Closed nondisplaced fracture of lateral  malleolus of left fibula, initial encounter    Rx / DC Orders ED Discharge Orders          Ordered    HYDROcodone-acetaminophen (NORCO/VICODIN) 5-325 MG tablet  Every 6 hours PRN        09/14/21 1556    naproxen (NAPROSYN) 500 MG tablet  2 times daily with meals        09/14/21 1556              Eber Hong, MD 09/14/21 1558

## 2021-09-14 NOTE — Discharge Instructions (Signed)
Your xray shows a fracture of the ankle The bones are lining up appropriately You need to wear the splint and do not walk on your leg until you have been cleared by the orthopedic surgeons - who you should see this week  Please take Naprosyn, 500mg  by mouth twice daily as needed for pain - this in an antiinflammatory medicine (NSAID) and is similar to ibuprofen - many people feel that it is stronger than ibuprofen and it is easier to take since it is a smaller pill.  Please use this only for 1 week - if your pain persists, you will need to follow up with your doctor in the office for ongoing guidance and pain control.    Thank you for letting take care of you today!  Please obtain all of your results from medical records or have your doctors office obtain the results - share them with your doctor - you should be seen at your doctors office in the next 2 days. Call today to arrange your follow up. Take the medications as prescribed. Please review all of the medicines and only take them if you do not have an allergy to them. Please be aware that if you are taking birth control pills, taking other prescriptions, ESPECIALLY ANTIBIOTICS may make the birth control ineffective - if this is the case, either do not engage in sexual activity or use alternative methods of birth control such as condoms until you have finished the medicine and your family doctor says it is OK to restart them. If you are on a blood thinner such as COUMADIN, be aware that any other medicine that you take may cause the coumadin to either work too much, or not enough - you should have your coumadin level rechecked in next 7 days if this is the case.  ?  It is also a possibility that you have an allergic reaction to any of the medicines that you have been prescribed - Everybody reacts differently to medications and while MOST people have no trouble with most medicines, you may have a reaction such as nausea, vomiting, rash, swelling,  shortness of breath. If this is the case, please stop taking the medicine immediately and contact your physician.   If you were given a medication in the ED such as percocet, vicodin, or morphine, be aware that these medicines are sedating and may change your ability to take care of yourself adequately for several hours after being given this medicines - you should not drive or take care of small children if you were given this medicine in the Emergency Department or if you have been prescribed these types of medicines. ?   You should return to the ER IMMEDIATELY if you develop severe or worsening symptoms.

## 2021-09-16 ENCOUNTER — Telehealth: Payer: Self-pay | Admitting: Orthopedic Surgery

## 2021-09-16 NOTE — Telephone Encounter (Signed)
Discussed with patient, she says she is throbbing/pain since falling.  Splint seems intact.  She is on her way to get her pain meds, in the car.  I advised her go home, elevate above heart level and ice as well.  She understands, will go to ED tonight if concern for splint failing/not holding secure.  Keeping appt scheduled for tomorrow here.

## 2021-09-16 NOTE — Telephone Encounter (Signed)
Patient called  to first confirm her appointment for tomorrow, 09/17/21, following Emergency roomfAnnie Penn visit 09/14/21;for fracture of left ankle; relays that she just fell again, and said she does not think she injured the left ankle. She asked if we have any work-in openings for today which I relayed at this time, we do not have. Please advise.

## 2021-09-17 ENCOUNTER — Other Ambulatory Visit: Payer: Self-pay

## 2021-09-17 ENCOUNTER — Encounter: Payer: Self-pay | Admitting: Orthopedic Surgery

## 2021-09-17 ENCOUNTER — Ambulatory Visit: Payer: 59 | Admitting: Orthopedic Surgery

## 2021-09-17 VITALS — BP 133/93 | HR 72 | Ht 64.5 in | Wt 180.0 lb

## 2021-09-17 DIAGNOSIS — S82832A Other fracture of upper and lower end of left fibula, initial encounter for closed fracture: Secondary | ICD-10-CM | POA: Diagnosis not present

## 2021-09-17 NOTE — Progress Notes (Signed)
New Patient Visit  Assessment: Veronica Peters is a 51 y.o. female with the following: 1. Other closed fracture of distal end of left fibula, initial encounter  Plan: Reviewed radiographs with Veronica Peters in clinic today.  Continue with the current splint.  Elevate is much as possible.  Medications as needed.  Remain nonweightbearing.  Follow-up in 2 weeks, plan to remove the splint and repeat x-rays.  We could consider transition to a walking boot at that time.   Follow-up: Return in about 2 weeks (around 10/01/2021).  Subjective:  Chief Complaint  Patient presents with   Ankle Injury    Fractured LT ankle DOI 09/14/21 s/p fall    History of Present Illness: Veronica Peters is a 51 y.o. female who presents for evaluation of a left ankle injury.  Just a few days ago, she fell at home, and twisted her left ankle.  She presented to the emergency department where x-rays demonstrated a spiral fracture of the distal fibula.  This was nondisplaced.  She was placed in a splint.  She has remained nonweightbearing.  She is adjusting to the crutches.  She did report one fall, but no worsening pain as a result.  She has been taking pain medications as needed.  She has tried to elevate her ankle is much as possible.  She states she still has pain, especially when it is in the dependent position.  However, her pain is slowly improving.  No other injuries are noted   Review of Systems: No fevers or chills No numbness or tingling No chest pain No shortness of breath No bowel or bladder dysfunction No GI distress No headaches   Medical History:  History reviewed. No pertinent past medical history.  Past Surgical History:  Procedure Laterality Date   BREAST ENHANCEMENT SURGERY     EYE SURGERY     REDUCTION MAMMAPLASTY Bilateral    TUBAL LIGATION     VESICOVAGINAL FISTULA CLOSURE W/ TAH      History reviewed. No pertinent family history. Social History   Tobacco Use   Smoking status:  Never   Smokeless tobacco: Never  Substance Use Topics   Alcohol use: No   Drug use: No    Allergies  Allergen Reactions   Sulfa Antibiotics Hives    Current Meds  Medication Sig   buPROPion (WELLBUTRIN XL) 300 MG 24 hr tablet Take 300 mg by mouth daily.   guaiFENesin (MUCINEX) 600 MG 12 hr tablet Take 1,200 mg by mouth 2 (two) times daily. Reported on 12/03/2015   HYDROcodone-acetaminophen (NORCO/VICODIN) 5-325 MG tablet Take 1 tablet by mouth every 6 (six) hours as needed.   ipratropium (ATROVENT) 0.03 % nasal spray Place 2 sprays into both nostrils 2 (two) times daily.   loratadine (CLARITIN) 10 MG tablet Take 10 mg by mouth daily. Reported on 12/03/2015   LORazepam (ATIVAN) 0.5 MG tablet Take 0.5 mg by mouth every 8 (eight) hours.   methocarbamol (ROBAXIN) 500 MG tablet Take 1 tablet (500 mg total) by mouth 2 (two) times daily.   naproxen (NAPROSYN) 500 MG tablet Take 1 tablet (500 mg total) by mouth 2 (two) times daily with a meal.   sertraline (ZOLOFT) 100 MG tablet Take 200 mg by mouth daily. Reported on 12/03/2015   simvastatin (ZOCOR) 40 MG tablet Take 40 mg by mouth every evening. Reported on 12/03/2015    Objective: BP (!) 133/93    Pulse 72    Ht 5' 4.5" (1.638 m)  Wt 180 lb (81.6 kg)    BMI 30.42 kg/m   Physical Exam:  General: Alert and oriented. and No acute distress. Gait: Unable to ambulate.  Evaluation of the left ankle demonstrates a well-positioned splint.  The dorsum of her foot is swollen.  She is able to flex and extend her exposed toes.  Sensation is intact to the exposed toes.  Toes are warm and well-perfused.  No skin breakdown around the edges of the splint  IMAGING: I personally reviewed images previously obtained from the ED  X-ray of the left ankle demonstrates a nondisplaced fracture of the distal fibula.  Mortise is intact.  No syndesmotic disruption.  New Medications:  No orders of the defined types were placed in this encounter.     Oliver Barre, MD  09/17/2021 12:21 PM

## 2021-09-24 NOTE — Telephone Encounter (Signed)
Done

## 2021-09-29 ENCOUNTER — Other Ambulatory Visit: Payer: Self-pay

## 2021-09-29 ENCOUNTER — Encounter: Payer: Self-pay | Admitting: Orthopedic Surgery

## 2021-09-29 ENCOUNTER — Ambulatory Visit (INDEPENDENT_AMBULATORY_CARE_PROVIDER_SITE_OTHER): Payer: 59 | Admitting: Orthopedic Surgery

## 2021-09-29 DIAGNOSIS — Z4789 Encounter for other orthopedic aftercare: Secondary | ICD-10-CM

## 2021-09-29 NOTE — Patient Instructions (Signed)
SOAK 3 X A DAY IN EPSOM SALT FOR 20 MIN

## 2021-09-29 NOTE — Progress Notes (Signed)
Chief Complaint  Patient presents with   Ankle Injury    09/14/21 patient has fracture states uncomfortable in sugar tong splint toes numb    Nondisplaced fibular fracture patient in sugar-tong splint splint got too tight patient came in due to burning sensation in the foot  Splint was removed  Everything distal to the sugar-tong was swollen including severe swelling of the foot slight decrease sensation.  Patient got good relief from removing the splint  Ace wrap was applied patient advised to soak the foot in Epsom salt 3 times a day and come in on Wednesday for recheck

## 2021-10-01 ENCOUNTER — Encounter: Payer: 59 | Admitting: Orthopedic Surgery

## 2021-10-01 ENCOUNTER — Ambulatory Visit (INDEPENDENT_AMBULATORY_CARE_PROVIDER_SITE_OTHER): Payer: 59 | Admitting: Orthopedic Surgery

## 2021-10-01 ENCOUNTER — Encounter: Payer: Self-pay | Admitting: Orthopedic Surgery

## 2021-10-01 ENCOUNTER — Ambulatory Visit: Payer: 59

## 2021-10-01 ENCOUNTER — Other Ambulatory Visit: Payer: Self-pay

## 2021-10-01 DIAGNOSIS — S82832A Other fracture of upper and lower end of left fibula, initial encounter for closed fracture: Secondary | ICD-10-CM

## 2021-10-01 DIAGNOSIS — S82832D Other fracture of upper and lower end of left fibula, subsequent encounter for closed fracture with routine healing: Secondary | ICD-10-CM

## 2021-10-01 NOTE — Patient Instructions (Signed)

## 2021-10-02 ENCOUNTER — Encounter: Payer: Self-pay | Admitting: Orthopedic Surgery

## 2021-10-02 NOTE — Progress Notes (Signed)
Return Patient Visit ? ?Assessment: ?Veronica Peters is a 51 y.o. female with the following: ?1.  Left distal fibula fracture, nondisplaced ? ?Plan: ?Repeat radiographs were obtained in clinic today.  The distal fibula fracture remains nondisplaced.  At this point, the swelling and pain is out of proportion to the severity of the injury.  She has no bruising about the foot, but she continues to have swelling, particularly dorsal and distal.  However, I am not concerned about CRPS at this time.  She has no redness, and tolerates palpation throughout the left foot.  She was fitted for a walking boot, and provided brief instructions on how to walk wearing this boot.  She seems to be more comfortable using the crutches.  She can weight-bear as tolerated.  She does not need immobilization at this time.  I provided her with ankle sprain exercises to initiate as soon as she can tolerate.  I will see her back in 2 weeks for repeat evaluation, to ensure that she continues to improve. ? ? ?Follow-up: ?Return in about 2 weeks (around 10/15/2021). ? ?Subjective: ? ?Chief Complaint  ?Patient presents with  ? Post-op Follow-up  ?   Nondisplaced fibular fracture DOI 09/14/21  ? ? ?History of Present Illness: ?Veronica Peters is a 51 y.o. female who returns for evaluation of a left ankle injury.  She injured her left ankle approximately 3 weeks ago.  When I saw her in clinic, the splint was stable.  However, she came to clinic earlier this week to see my partner, due to swelling and pain resulting from the splint.  The splint was removed, and she was too swollen to be placed into a boot.  Since then, she has been wrapping the leg and ankle, and completing some Epsom salt soaks.  She states the swelling has improved.  She continues to ambulate with the assistance of crutches.  She is elevating the foot is much as possible. ? ?Review of Systems: ?No fevers or chills ?No numbness or tingling ?No chest pain ?No shortness of breath ?No  bowel or bladder dysfunction ?No GI distress ?No headaches ? ?Objective: ?There were no vitals taken for this visit. ? ?Physical Exam: ? ?General: Alert and oriented. and No acute distress. ?Gait: Ambulates with the assistance of crutches ? ?Left foot is swollen.  No swelling over the leg.  Sensation is intact over the dorsum of her foot.  She is able to dorsiflex her ankle and her great toe.  She has tenderness to palpation over the lateral ankle.  No tenderness to palpation over the medial ankle.  No pain with range of motion of the tibiotalar joint.  Toes are warm and well-perfused. ? ?IMAGING: ?I personally reviewed images previously obtained from the ED ? ?X-ray of the left ankle was obtained in clinic today.  These were compared to prior x-rays.  A minimally displaced fracture is seen in the distal fibula.  This remains unchanged.  The mortise is congruent.  There is no syndesmotic disruption.  No acute injuries are noted. ? ?Impression: Nondisplaced left distal fibula fracture ? ?New Medications:  ?No orders of the defined types were placed in this encounter. ? ? ? ? ?Oliver Barre, MD ? ?10/02/2021 ?9:51 AM ? ? ?

## 2021-10-03 ENCOUNTER — Encounter: Payer: 59 | Admitting: Orthopedic Surgery

## 2021-10-06 ENCOUNTER — Telehealth: Payer: Self-pay | Admitting: Orthopedic Surgery

## 2021-10-06 DIAGNOSIS — S82832D Other fracture of upper and lower end of left fibula, subsequent encounter for closed fracture with routine healing: Secondary | ICD-10-CM

## 2021-10-06 NOTE — Telephone Encounter (Signed)
Patient called to relay she has decided to try physical therapy, as discussed at last visit. She lives in [place]. Please order physical therapy, and please let patient know. ?

## 2021-10-07 ENCOUNTER — Ambulatory Visit: Payer: 59 | Admitting: Orthopaedic Surgery

## 2021-10-07 NOTE — Telephone Encounter (Signed)
Referral placed.

## 2021-10-15 ENCOUNTER — Ambulatory Visit (INDEPENDENT_AMBULATORY_CARE_PROVIDER_SITE_OTHER): Payer: 59

## 2021-10-15 ENCOUNTER — Ambulatory Visit (INDEPENDENT_AMBULATORY_CARE_PROVIDER_SITE_OTHER): Payer: 59 | Admitting: Orthopedic Surgery

## 2021-10-15 ENCOUNTER — Other Ambulatory Visit: Payer: Self-pay

## 2021-10-15 ENCOUNTER — Encounter: Payer: Self-pay | Admitting: Orthopedic Surgery

## 2021-10-15 DIAGNOSIS — S82832D Other fracture of upper and lower end of left fibula, subsequent encounter for closed fracture with routine healing: Secondary | ICD-10-CM

## 2021-10-15 NOTE — Progress Notes (Signed)
Return Patient Visit ? ?Assessment: ?Veronica Peters is a 51 y.o. female with the following: ?1.  Left distal fibula fracture, nondisplaced ? ?Plan: ?Radiographs remained stable.  Her swelling and bruising has significantly improved.  Minimal tenderness about the ankle.  She can start to transition out of the walking boot.  I have encouraged her to start working with physical therapy.  I have given her ankle exercises in the past that she can continue to work on.  Physical therapy will start in approximately 2 weeks.  I will see her back in 6 weeks for repeat evaluation. ? ? ?Follow-up: ?No follow-ups on file. ? ?Subjective: ? ?Chief Complaint  ?Patient presents with  ? Ankle Pain  ?  Left ankle fx follow up.  Left distal fibula fracture, nondisplaced.  Continue fx boot, walks with an altered gait.  Having some heel pain at times.  Thinks it is from the way she is walking.  First PT appt 10/27/21 (she needed later afternoon appts).  No meds.  ? ? ?History of Present Illness: ?Veronica Peters is a 51 y.o. female who returns for evaluation of a left ankle injury.  She injured her left ankle approximately 5 weeks ago.  She notes significant improvement since I saw her last in clinic.  Her swelling has improved.  She notes swelling discoloration and towards the end of the day, this improves overnight.  She has been wrapping her leg and gauze overnight.  She is ambulating better with a walking boot on.  She notes some tenderness in the heel due to walking in the boot. ? ? ?Review of Systems: ?No fevers or chills ?No numbness or tingling ?No chest pain ?No shortness of breath ?No bowel or bladder dysfunction ?No GI distress ?No headaches ? ?Objective: ?There were no vitals taken for this visit. ? ?Physical Exam: ? ?General: Alert and oriented. and No acute distress. ?Gait: Ambulates with the assistance of crutches ? ? ?Evaluation of left foot and ankle demonstrates no swelling.  No bruising is appreciated.  No tenderness to  palpation over the distal fibula.  Sensation is intact over the dorsum of her foot.  She is able to get to a plantigrade position.  No skin breakdown or callus formation.  Toes are warm and well-perfused. ? ?IMAGING: ?I personally reviewed images previously obtained from the ED ? ?X-ray of the left ankle was obtained in clinic today.  No acute injuries are noted.  Mortise is congruent.  No syndesmotic disruption.  Previously identified fibula fracture is not as visible today. ? ?Impression: Nondisplaced left distal fibula fracture ? ?New Medications:  ?No orders of the defined types were placed in this encounter. ? ? ? ? ?Oliver Barre, MD ? ?10/15/2021 ?9:49 AM ? ? ?

## 2021-10-27 ENCOUNTER — Ambulatory Visit (HOSPITAL_COMMUNITY): Payer: 59 | Attending: Orthopedic Surgery

## 2021-10-27 ENCOUNTER — Other Ambulatory Visit: Payer: Self-pay

## 2021-10-27 DIAGNOSIS — S82832D Other fracture of upper and lower end of left fibula, subsequent encounter for closed fracture with routine healing: Secondary | ICD-10-CM | POA: Insufficient documentation

## 2021-10-27 DIAGNOSIS — R2689 Other abnormalities of gait and mobility: Secondary | ICD-10-CM | POA: Diagnosis not present

## 2021-10-27 NOTE — Therapy (Signed)
?OUTPATIENT PHYSICAL THERAPY LOWER EXTREMITY EVALUATION ? ? ?Patient Name: Veronica Peters ?MRN: [id number] ?DOB:[birth year], 51 y.o., female ?Today's Date: 10/27/2021 ? ? PT End of Session - 10/27/21 1611   ? ? Visit Number 1   ? Number of Visits 6   ? Date for PT Re-Evaluation 12/01/21   ? Authorization Type [facility]; 23 visit limit; no auth required   ? PT Start Time 1603   ? PT Stop Time 1644   ? PT Time Calculation (min) 41 min   ? ?  ?  ? ?  ? ? ?No past medical history on file. ?Past Surgical History:  ?Procedure Laterality Date  ? BREAST ENHANCEMENT SURGERY    ? EYE SURGERY    ? REDUCTION MAMMAPLASTY Bilateral   ? TUBAL LIGATION    ? VESICOVAGINAL FISTULA CLOSURE W/ TAH    ? ?There are no problems to display for this patient. ? ? ?PCP: Lupita Raider, MD ? ?REFERRING PROVIDER: Oliver Barre, MD [facility]TH[facility] [place]  ? ?REFERRING DIAG: K93.267T (ICD-10-CM) - Other closed fracture of distal end of left fibula with routine healing, subsequent encounter  ?THERAPY DIAG:  ?Left fibula fx.  ? ?ONSET DATE:  ?DOI 09/14/21 s/p fall   ?  ? ?SUBJECTIVE: Patient had a slip and fall 09/14/21 at home.  Went to the ER.  Put in a soft cast. The doctor told her at her last visit she could wean back into her tennis shoes. She is seeing Dr. Dallas Schimke. F/U April 26th ? ?SUBJECTIVE STATEMENT: ?DOI 09/14/21 s/p fall  ? Veronica Peters is a 51 y.o. female with the following: ?1.  Left distal fibula fracture, nondisplaced ? ?PERTINENT HISTORY: ?Veronica Peters is a 51 y.o. female with the following: ?1.  Left distal fibula fracture, nondisplaced ? ?PAIN:  ?Are you having pain? Yes: NPRS scale: 5/10 ?Pain location: L foot and ankle ?Pain description: tight, sore ?Aggravating factors: standing, walking ?Relieving factors: rest ? ?PRECAUTIONS: Fall ? ?WEIGHT BEARING RESTRICTIONS No ? ?FALLS:  ?Has patient fallen in last 6 months? Yes, Number of falls: 2 ? ?LIVING ENVIRONMENT: ?Lives with: lives  with their family and lives with their spouse ?Lives in: House/apartment ?Stairs: Yes: Internal: 1 steps; none and External: 1 steps; none ?Has following equipment at home: Crutches and shower chair ? 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 ?PLOF: Independent ? ?PATIENT GOALS return to PLOF; get mobility back ? ? ?OBJECTIVE:  ? ?DIAGNOSTIC FINDINGS: xray fibula fx.  ? ?PATIENT SURVEYS:  ?FOTO 60 ? ?COGNITION: ? Overall cognitive status: Within functional limits for tasks assessed   ?  ?SENSATION: ?WFL ? ? ? ?PALPATION: ?Tender lateral malleoli ? ? ?LE ROM: ? ?Active ROM Right ?10/27/2021 Left ?10/27/2021  ?Hip flexion    ?Hip extension    ?Hip abduction    ?Hip adduction    ?Hip internal rotation    ?Hip external rotation    ?Knee flexion    ?Knee extension    ?Ankle dorsiflexion wfl 8  ?Ankle plantarflexion wfl 25  ?Ankle inversion wfl 12  ?Ankle eversion wfl 8  ? (Blank rows = not tested) ?FUNCTIONAL TESTS:  ?2 minute walk test: 339 ft ? ?GAIT: ?Distance walked: 339 ?Assistive device utilized: None ?Level of assistance: SBA ?Comments: external rotation Left lower extremity ? ? ? ?TODAY'S TREATMENT: ?Physical therapy evaluation, HEP instruction ? ? ?PATIENT EDUCATION:  ?Education details: HEP, ice  for pain control ?Person educated: Patient ?Education method: Explanation, Demonstration, and Handouts ?Education comprehension: verbalized understanding, returned demonstration, and needs further education ? ? ?HOME EXERCISE PROGRAM: ?Access Code: [email or web address] ?URL: [email or web address] ?Date: 10/27/2021 ?Prepared by: [facility] ? ?Exercises ?- Seated Gastroc Stretch with Strap  - 2 x daily - 7 x weekly - 1 sets - 5 reps ?- Supine Ankle Dorsiflexion and Plantarflexion AROM  - 2 x daily - 7 x weekly - 1 sets - 10 reps ?- Supine Ankle Inversion and Eversion AROM  - 2 x daily - 7 x weekly - 10 reps ?- Seated Ankle Dorsiflexion Stretch  - 2 x daily - 7 x weekly - 1 sets - 10  reps ? ?ASSESSMENT: ? ?CLINICAL IMPRESSION: ?Patient is a 51 y.o. female who was seen today for physical therapy evaluation and treatment of  Left distal fibula fracture, nondisplaced.  ? ? ?OBJECTIVE IMPAIRMENTS Abnormal gait, decreased activity tolerance, decreased balance, decreased coordination, decreased endurance, decreased knowledge of condition, decreased knowledge of use of DME, decreased mobility, difficulty walking, decreased ROM, decreased strength, decreased safety awareness, hypomobility, increased edema, impaired perceived functional ability, impaired flexibility, impaired tone, and pain.  ? ?ACTIVITY LIMITATIONS cleaning, community activity, driving, meal prep, occupation, laundry, yard work, shopping, and yard work.  ? ? ?REHAB POTENTIAL: Good ? ?CLINICAL DECISION MAKING: Stable/uncomplicated ? ?EVALUATION COMPLEXITY: Low ? ? ?GOALS: ?Goals reviewed with patient? Yes ? ?SHORT TERM GOALS: Target date: 11/17/2021 ? ?Patient with be independent with initial HEP to  improve functional outcomes.  ? ?Goal status: INITIAL ? ?2.  Patient will increased distance to 350 ft to demonstrate improvement in walking commmunity distances ?Baseline: 339 ?Goal status: INITIAL ? ? ?LONG TERM GOALS: Target date: 12/08/2021 ? ?Patient will be independent with advanced HEP and self management strategies to improve quality of life and functional outcomes. ? ?Goal status: INITIAL ? ?2.   Patient will meet predicted FOTO score to demonstrate improved overall function. ? ?Baseline: 60 ?Goal status: INITIAL ? ?3.  Patient will report at least 50% improvement in overall symptoms and/or function to demonstrate improved functional mobility ? ? ?Goal status: INITIAL ? ?4.  Patient will improve ambulation distance with to 360 ft to demonstrate return to PLOF ?Baseline: 339 ?Goal status: INITIAL ? ?5.  Patient will improve Left ankle AROM to within 5 degrees all measured motions to improve mobility with navigating uneven  surfaces ?Baseline:  ?Ankle dorsiflexion wfl 8  ?Ankle plantarflexion wfl 25  ?Ankle inversion wfl 12  ?Ankle eversion wfl 8  ? ?Goal status: INITIAL ? ? ?PLAN: ?PT FREQUENCY: 1x/week ? ?PT DURATION: 6 weeks ? ?PLANNED INTERVENTIONS: Therapeutic exercises, Therapeutic activity, Neuromuscular re-education, Balance training, Gait training, Patient/Family education, Joint manipulation, Joint mobilization, Stair training, Vestibular training, Orthotic/Fit training, DME instructions, Aquatic Therapy, Electrical stimulation, Cryotherapy, Moist heat, Compression bandaging, Taping, Ultrasound, Parrafin, Fluidotherapy, Contrast bath, Biofeedback, and Manual therapy ? ?PLAN FOR NEXT SESSION: Review goals and HEP, progress lower extremity strengthening and mobility as able ? ? ?5:12 PM, 10/27/21 ?Rasheema Truluck Small Camira Geidel MPT ?[facility] ?[place] [phone] ?Ph:[phone] ? ?

## 2021-10-28 ENCOUNTER — Telehealth (HOSPITAL_COMMUNITY): Payer: Self-pay

## 2021-10-28 NOTE — Telephone Encounter (Signed)
L/m to cx no reason given ?

## 2021-10-29 ENCOUNTER — Ambulatory Visit (HOSPITAL_COMMUNITY): Payer: 59

## 2021-11-03 ENCOUNTER — Ambulatory Visit (HOSPITAL_COMMUNITY): Payer: 59 | Attending: Orthopedic Surgery

## 2021-11-03 DIAGNOSIS — S82832D Other fracture of upper and lower end of left fibula, subsequent encounter for closed fracture with routine healing: Secondary | ICD-10-CM | POA: Insufficient documentation

## 2021-11-03 DIAGNOSIS — R2689 Other abnormalities of gait and mobility: Secondary | ICD-10-CM | POA: Insufficient documentation

## 2021-11-03 NOTE — Therapy (Signed)
?OUTPATIENT PHYSICAL THERAPY TREATMENT NOTE ? ? ?Patient Name: Veronica Peters ?MRN: [id number] ?DOB:Jan 08, 1971, 51 y.o., female ?Today's Date: 11/03/2021 ? ?PCP: Lupita Raider, MD ?REFERRING PROVIDER: Lupita Raider, MD ? ? PT End of Session - 11/03/21 1556   ? ? Visit Number 2   ? Number of Visits 6   ? Date for PT Re-Evaluation 12/01/21   ? Authorization Type [facility]; 23 visit limit; no auth required   ? PT Start Time 1555   ? PT Stop Time 1640   ? PT Time Calculation (min) 45 min   ? ?  ?  ? ?  ? ? ?No past medical history on file. ?Past Surgical History:  ?Procedure Laterality Date  ? BREAST ENHANCEMENT SURGERY    ? EYE SURGERY    ? REDUCTION MAMMAPLASTY Bilateral   ? TUBAL LIGATION    ? VESICOVAGINAL FISTULA CLOSURE W/ TAH    ? ?There are no problems to display for this patient. ? ? ?REFERRING DIAG: Left fibula fracture ? ?THERAPY DIAG:  ?Other abnormalities of gait and mobility ? ?Other closed fracture of distal end of left fibula with routine healing, subsequent encounter ? ?SUBJECTIVE: Patient reports she is trying to be more aware of how she is walking but has not been too consistent with HEP.  ? ?PAIN:  ?Are you having pain? No ? ? ? ? ?OBJECTIVE:  ?  ?  ?LE ROM: ?  ?Active ROM Right ?10/27/2021 Left ?10/27/2021  ?Hip flexion      ?Hip extension      ?Hip abduction      ?Hip adduction      ?Hip internal rotation      ?Hip external rotation      ?Knee flexion      ?Knee extension      ?Ankle dorsiflexion wfl 8  ?Ankle plantarflexion wfl 25  ?Ankle inversion wfl 12  ?Ankle eversion wfl 8  ? (Blank rows = not tested) ?FUNCTIONAL TESTS: at eval ?2 minute walk test: 339 ft ?  ?GAIT: at eval ?Distance walked: 339 ?Assistive device utilized: None ?Level of assistance: SBA ?Comments: external rotation Left lower extremity ?  ?  ?  ?TODAY'S TREATMENT: ?11/03/21 ?Nustep level 8, arms 8, x 4' ?Seated Left gastroc stretch with strap 5 x 20" ?Supine ankle AROM In/EV/DF/PF x 10 each ?Heel slides for ankle  dorsiflexion x 10 ?Seated self manual plantar flexion stretch 10 x 10" ?BAPS board level 2 CCW and CC x 20 reps each ?8" step, lunge for soleus stretch x 20  ?Shallow squats in // bars x 10 ?Slant board 5 x 20"  ? ? ? ?  ?PATIENT EDUCATION:  ?Education details: HEP, ice for pain control ?Person educated: Patient ?Education method: Explanation, Demonstration, and Handouts ?Education comprehension: verbalized understanding, returned demonstration, and needs further education ?  ?  ?HOME EXERCISE PROGRAM: ?Access Code: [email or web address] ?URL: [email or web address] ?Date: 11/03/2021 ?Prepared by: [facility] ? ?Exercises ?- Seated Ankle Plantarflexion Dorsiflexion PROM  - 1 x daily - 7 x weekly - 1 sets - 10 reps - 10 sec hold ? ?Exercises ?- Seated Gastroc Stretch with Strap  - 2 x daily - 7 x weekly - 1 sets - 5 reps ?- Supine Ankle Dorsiflexion and Plantarflexion AROM  - 2 x daily - 7 x weekly - 1 sets - 10 reps ?- Supine Ankle Inversion and Eversion AROM  - 2 x daily - 7 x weekly - 10 reps ?- Seated Ankle  Dorsiflexion Stretch  - 2 x daily - 7 x weekly - 1 sets - 10 reps ?  ?ASSESSMENT: ?  ?CLINICAL IMPRESSION: ?Today's treatment focused on review of goals and HEP and progression of ankle and lower extremity exercises to address mobility and increase strength.  Patient continues with external rotation of Left lower extremity with ambulation and limited motion of the left ankle however she works hard in therapy and demonstrates awareness of deficits; noticeably tries to correct her gait pattern by improving heel strike and keeping toe straight. Patient will continue to benefit from skilled therapy services to reduce deficits and improve functional level. ? ?  ?OBJECTIVE IMPAIRMENTS Abnormal gait, decreased activity tolerance, decreased balance, decreased coordination, decreased endurance, decreased knowledge of condition, decreased knowledge of use of DME, decreased mobility, difficulty walking, decreased ROM,  decreased strength, decreased safety awareness, hypomobility, increased edema, impaired perceived functional ability, impaired flexibility, impaired tone, and pain.  ?  ?ACTIVITY LIMITATIONS cleaning, community activity, driving, meal prep, occupation, laundry, yard work, shopping, and yard work.  ?  ?  ?REHAB POTENTIAL: Good ?  ?CLINICAL DECISION MAKING: Stable/uncomplicated ?  ?EVALUATION COMPLEXITY: Low ?  ?  ?GOALS: ?Goals reviewed with patient? Yes ?  ?SHORT TERM GOALS: Target date: 11/17/2021 ?  ?Patient with be independent with initial HEP to  improve functional outcomes.  ?  ?Goal status: INITIAL ?  ?2.  Patient will increased distance to 350 ft to demonstrate improvement in walking commmunity distances ?Baseline: 339 ?Goal status: INITIAL ?  ?  ?LONG TERM GOALS: Target date: 12/08/2021 ?  ?Patient will be independent with advanced HEP and self management strategies to improve quality of life and functional outcomes. ?  ?Goal status: INITIAL ?  ?2.   Patient will meet predicted FOTO score to demonstrate improved overall function. ?  ?Baseline: 60 ?Goal status: INITIAL ?  ?3.  Patient will report at least 50% improvement in overall symptoms and/or function to demonstrate improved functional mobility ?  ?  ?Goal status: INITIAL ?  ?4.  Patient will improve ambulation distance with to 360 ft to demonstrate return to PLOF ?Baseline: 339 ?Goal status: INITIAL ?  ?5.  Patient will improve Left ankle AROM to within 5 degrees all measured motions to improve mobility with navigating uneven surfaces ?Baseline:  ?Ankle dorsiflexion wfl 8  ?Ankle plantarflexion wfl 25  ?Ankle inversion wfl 12  ?Ankle eversion wfl 8  ?  ?Goal status: INITIAL ?  ?  ?PLAN: ?PT FREQUENCY: 1x/week ?  ?PT DURATION: 6 weeks ?  ?PLANNED INTERVENTIONS: Therapeutic exercises, Therapeutic activity, Neuromuscular re-education, Balance training, Gait training, Patient/Family education, Joint manipulation, Joint mobilization, Stair training,  Vestibular training, Orthotic/Fit training, DME instructions, Aquatic Therapy, Electrical stimulation, Cryotherapy, Moist heat, Compression bandaging, Taping, Ultrasound, Parrafin, Fluidotherapy, Contrast bath, Biofeedback, and Manual therapy ?  ?PLAN FOR NEXT SESSION: progress lower extremity strengthening and mobility as able; try balance activities.  ?  ?  ? ? ?4:40 PM, 11/03/21 ?Alonza Knisley Small Pacer Dorn MPT ?[facility] ?[place] [phone] ?[phone] ? ?  ? ?

## 2021-11-05 ENCOUNTER — Encounter (HOSPITAL_COMMUNITY): Payer: Self-pay

## 2021-11-05 ENCOUNTER — Ambulatory Visit (HOSPITAL_COMMUNITY): Payer: 59

## 2021-11-05 DIAGNOSIS — R2689 Other abnormalities of gait and mobility: Secondary | ICD-10-CM | POA: Diagnosis not present

## 2021-11-05 DIAGNOSIS — S82832D Other fracture of upper and lower end of left fibula, subsequent encounter for closed fracture with routine healing: Secondary | ICD-10-CM

## 2021-11-05 NOTE — Therapy (Signed)
?OUTPATIENT PHYSICAL THERAPY TREATMENT NOTE ? ? ?Patient Name: Veronica Peters ?MRN: [id number] ?DOB:[birth year], 51 y.o., female ?Today's Date: 11/05/2021 ? ?PCP: Lupita Raider, MD ?REFERRING PROVIDER: Thane Edu, MD ? ? PT End of Session - 11/05/21 1659   ? ? Visit Number 3   ? Number of Visits 6   ? Date for PT Re-Evaluation 12/01/21   ? Authorization Type [facility]; 23 visit limit; no auth required   ? PT Start Time [phone]   ? PT Stop Time 1656   ? PT Time Calculation (min) 38 min   ? Activity Tolerance Patient tolerated treatment well   ? Behavior During Therapy [facility] for tasks assessed/performed   ? ?  ?  ? ?  ? ? ? ?History reviewed. No pertinent past medical history. ?Past Surgical History:  ?Procedure Laterality Date  ? BREAST ENHANCEMENT SURGERY    ? EYE SURGERY    ? REDUCTION MAMMAPLASTY Bilateral   ? TUBAL LIGATION    ? VESICOVAGINAL FISTULA CLOSURE W/ TAH    ? ?There are no problems to display for this patient. ? ? ?REFERRING DIAG: Left fibula fracture ? ?THERAPY DIAG:  ?Other abnormalities of gait and mobility ? ?Other closed fracture of distal end of left fibula with routine healing, subsequent encounter ? ?SUBJECTIVE: Pt reports she is making good progress, has began working in her yard.  Stated she has stiffness today.  Has some swelling, does own compression socks though not wearing today.  Pt liked the new slant board stretch complete last session.   ? ?PAIN:  ?Are you having pain? No ? ? ? ? ?OBJECTIVE:  ?  ?  ?LE ROM: ?  ?Active ROM Right ?10/27/2021 Left ?10/27/2021  ?Hip flexion      ?Hip extension      ?Hip abduction      ?Hip adduction      ?Hip internal rotation      ?Hip external rotation      ?Knee flexion      ?Knee extension      ?Ankle dorsiflexion wfl 8  ?Ankle plantarflexion wfl 25  ?Ankle inversion wfl 12  ?Ankle eversion wfl 8  ? (Blank rows = not tested) ?FUNCTIONAL TESTS: at eval ?2 minute walk test: 339 ft ?  ?GAIT: at eval ?Distance walked: 339 ?Assistive device utilized:  None ?Level of assistance: SBA ?Comments: external rotation Left lower extremity ?  ?  ?  ?TODAY'S TREATMENT: ? ?11/05/21: ?Heel raises incline slope 20x 5" ?Toe raises decline slope 20x 5" ?12 in knee drive for dorsiflexion 5x 10" ?SLS Rt 21", Lt 19" ?Vector stance 3x 5" ?Gastroc stretch against wall HEP 3x 30 ?   Soleus 2x 30" against wall HEP  2x 30" ?Tandem stance 1x 30" ?  2x 30" on foam ?Slant board x 2 30" ? ? ? ?11/03/21 ?Nustep level 8, arms 8, x 4' ?Seated Left gastroc stretch with strap 5 x 20" ?Supine ankle AROM In/EV/DF/PF x 10 each ?Heel slides for ankle dorsiflexion x 10 ?Seated self manual plantar flexion stretch 10 x 10" ?BAPS board level 2 CCW and CC x 20 reps each ?8" step, lunge for soleus stretch x 20  ?Shallow squats in // bars x 10 ?Slant board 5 x 20"  ? ? ? ?  ?PATIENT EDUCATION:  ?Education details: HEP, ice for pain control ?Person educated: Patient ?Education method: Explanation, Demonstration, and Handouts ?Education comprehension: verbalized understanding, returned demonstration, and needs further education ?  ?  ?  HOME EXERCISE PROGRAM: ?Access Code: [email or web address] ?URL: [email or web address] ?Date: 11/03/2021 ?Prepared by: [facility] ? ?Exercises ?- Seated Ankle Plantarflexion Dorsiflexion PROM  - 1 x daily - 7 x weekly - 1 sets - 10 reps - 10 sec hold ? ?Exercises ?- Seated Gastroc Stretch with Strap  - 2 x daily - 7 x weekly - 1 sets - 5 reps ?- Supine Ankle Dorsiflexion and Plantarflexion AROM  - 2 x daily - 7 x weekly - 1 sets - 10 reps ?- Supine Ankle Inversion and Eversion AROM  - 2 x daily - 7 x weekly - 10 reps ?- Seated Ankle Dorsiflexion Stretch  - 2 x daily - 7 x weekly - 1 sets - 10 reps ? ?12/04/21: Standing Gastroc/soleus stretch, heel/toe raises. ?  ?ASSESSMENT: ?  ?CLINICAL IMPRESSION: ?Pt progressing well towards goals.  Progressed to closed chain LE strengthening that was tolerated well and additional balance training.  Pt stated she was surprised with ability to  achieve SLS.  Added dynamic surface that did required intermittent HHA.  Added standing gastroc/soleus stretches to HEP with printout given.  No reports of pain through session. ? ?  ?OBJECTIVE IMPAIRMENTS Abnormal gait, decreased activity tolerance, decreased balance, decreased coordination, decreased endurance, decreased knowledge of condition, decreased knowledge of use of DME, decreased mobility, difficulty walking, decreased ROM, decreased strength, decreased safety awareness, hypomobility, increased edema, impaired perceived functional ability, impaired flexibility, impaired tone, and pain.  ?  ?ACTIVITY LIMITATIONS cleaning, community activity, driving, meal prep, occupation, laundry, yard work, shopping, and yard work.  ?  ?  ?REHAB POTENTIAL: Good ?  ?CLINICAL DECISION MAKING: Stable/uncomplicated ?  ?EVALUATION COMPLEXITY: Low ?  ?  ?GOALS: ?Goals reviewed with patient? Yes ?  ?SHORT TERM GOALS: Target date: 11/17/2021 ?  ?Patient with be independent with initial HEP to  improve functional outcomes.  ?  ?Goal status: Ongoing ?  ?2.  Patient will increased distance to 350 ft to demonstrate improvement in walking commmunity distances ?Baseline: 339 ?Goal status: Ongoing ?  ?  ?LONG TERM GOALS: Target date: 12/08/2021 ?  ?Patient will be independent with advanced HEP and self management strategies to improve quality of life and functional outcomes. ?  ?Goal status: Ongoing ?  ?2.   Patient will meet predicted FOTO score to demonstrate improved overall function. ?  ?Baseline: 60 ?Goal status: Ongoing ?  ?3.  Patient will report at least 50% improvement in overall symptoms and/or function to demonstrate improved functional mobility ?  ?  ?Goal status:Ongoing ?  ?4.  Patient will improve ambulation distance with to 360 ft to demonstrate return to PLOF ?Baseline: 339 ?Goal status: Ongoing ?  ?5.  Patient will improve Left ankle AROM to within 5 degrees all measured motions to improve mobility with  navigating uneven surfaces ?Baseline:  ? Rt Lt  ?Ankle dorsiflexion wfl 8  ?Ankle plantarflexion wfl 25  ?Ankle inversion wfl 12  ?Ankle eversion wfl 8  ?  ?Goal status: INITIAL ?  ?  ?PLAN: ?PT FREQUENCY: 1x/week ?  ?PT DURATION: 6 weeks ?  ?PLANNED INTERVENTIONS: Therapeutic exercises, Therapeutic activity, Neuromuscular re-education, Balance training, Gait training, Patient/Family education, Joint manipulation, Joint mobilization, Stair training, Vestibular training, Orthotic/Fit training, DME instructions, Aquatic Therapy, Electrical stimulation, Cryotherapy, Moist heat, Compression bandaging, Taping, Ultrasound, Parrafin, Fluidotherapy, Contrast bath, Biofeedback, and Manual therapy ?  ?PLAN FOR NEXT SESSION: progress lower extremity strengthening and mobility as able; Progress functional strengthening as able, add STS, squats and step up  training.    ?  ?  ? ?Becky Saxasey Wallie Lagrand, LPTA/CLT; CBIS ?[phone] ? ?5:00 PM, 11/05/21 ? ? ?  ? ?

## 2021-11-10 ENCOUNTER — Encounter (HOSPITAL_COMMUNITY): Payer: 59

## 2021-11-10 NOTE — Therapy (Incomplete)
?OUTPATIENT PHYSICAL THERAPY TREATMENT NOTE ? ? ?Patient Name: Veronica Peters ?MRN: [id number] ?DOB:22-May-1971, 51 y.o., female ?Today's Date: 11/10/2021 ? ?PCP: Mayra Neer, MD ?REFERRING PROVIDER: Larena Glassman, MD ? ? ? ? ? ?No past medical history on file. ?Past Surgical History:  ?Procedure Laterality Date  ? BREAST ENHANCEMENT SURGERY    ? EYE SURGERY    ? REDUCTION MAMMAPLASTY Bilateral   ? TUBAL LIGATION    ? VESICOVAGINAL FISTULA CLOSURE W/ TAH    ? ?There are no problems to display for this patient. ? ? ?REFERRING DIAG: Left fibula fracture ? ?THERAPY DIAG:  ?No diagnosis found. ? ?SUBJECTIVE: Pt reports she is making good progress, has began working in her yard.  Stated she has stiffness today.  Has some swelling, does own compression socks though not wearing today.  Pt liked the new slant board stretch complete last session.   ? ?PAIN:  ?Are you having pain? No ? ? ? ? ?OBJECTIVE:  ?  ?  ?LE ROM: ?  ?Active ROM Right ?10/27/2021 Left ?10/27/2021  ?Hip flexion      ?Hip extension      ?Hip abduction      ?Hip adduction      ?Hip internal rotation      ?Hip external rotation      ?Knee flexion      ?Knee extension      ?Ankle dorsiflexion wfl 8  ?Ankle plantarflexion wfl 25  ?Ankle inversion wfl 12  ?Ankle eversion wfl 8  ? (Blank rows = not tested) ?FUNCTIONAL TESTS: at eval ?2 minute walk test: 339 ft ?  ?GAIT: at eval ?Distance walked: 339 ?Assistive device utilized: None ?Level of assistance: SBA ?Comments: external rotation Left lower extremity ?  ?  ?  ?TODAY'S TREATMENT: ? ?11/05/21: ?Heel raises incline slope 20x 5" ?Toe raises decline slope 20x 5" ?12 in knee drive for dorsiflexion 5x 10" ?SLS Rt 21", Lt 19" ?Vector stance 3x 5" ?Gastroc stretch against wall HEP 3x 30 ?   Soleus 2x 30" against wall HEP  2x 30" ?Tandem stance 1x 30" ?  2x 30" on foam ?Slant board x 2 30" ? ? ? ?11/03/21 ?Nustep level 8, arms 8, x 4' ?Seated Left gastroc stretch with strap 5 x 20" ?Supine ankle AROM In/EV/DF/PF x 10  each ?Heel slides for ankle dorsiflexion x 10 ?Seated self manual plantar flexion stretch 10 x 10" ?BAPS board level 2 CCW and CC x 20 reps each ?8" step, lunge for soleus stretch x 20  ?Shallow squats in // bars x 10 ?Slant board 5 x 20"  ? ? ? ?  ?PATIENT EDUCATION:  ?Education details: HEP, ice for pain control ?Person educated: Patient ?Education method: Explanation, Demonstration, and Handouts ?Education comprehension: verbalized understanding, returned demonstration, and needs further education ?  ?  ?HOME EXERCISE PROGRAM: ?Access Code: [email or web address] ?URL: [email or web address] ?Date: 11/03/2021 ?Prepared by: [facility] ? ?Exercises ?- Seated Ankle Plantarflexion Dorsiflexion PROM  - 1 x daily - 7 x weekly - 1 sets - 10 reps - 10 sec hold ? ?Exercises ?- Seated Gastroc Stretch with Strap  - 2 x daily - 7 x weekly - 1 sets - 5 reps ?- Supine Ankle Dorsiflexion and Plantarflexion AROM  - 2 x daily - 7 x weekly - 1 sets - 10 reps ?- Supine Ankle Inversion and Eversion AROM  - 2 x daily - 7 x weekly - 10 reps ?- Seated Ankle Dorsiflexion Stretch  -  2 x daily - 7 x weekly - 1 sets - 10 reps ? ?12/04/21: Standing Gastroc/soleus stretch, heel/toe raises. ?  ?ASSESSMENT: ?  ?CLINICAL IMPRESSION: ?Pt progressing well towards goals.  Progressed to closed chain LE strengthening that was tolerated well and additional balance training.  Pt stated she was surprised with ability to achieve SLS.  Added dynamic surface that did required intermittent HHA.  Added standing gastroc/soleus stretches to HEP with printout given.  No reports of pain through session. ? ?  ?OBJECTIVE IMPAIRMENTS Abnormal gait, decreased activity tolerance, decreased balance, decreased coordination, decreased endurance, decreased knowledge of condition, decreased knowledge of use of DME, decreased mobility, difficulty walking, decreased ROM, decreased strength, decreased safety awareness, hypomobility, increased edema, impaired perceived  functional ability, impaired flexibility, impaired tone, and pain.  ?  ?ACTIVITY LIMITATIONS cleaning, community activity, driving, meal prep, occupation, laundry, yard work, shopping, and yard work.  ?  ?  ?REHAB POTENTIAL: Good ?  ?CLINICAL DECISION MAKING: Stable/uncomplicated ?  ?EVALUATION COMPLEXITY: Low ?  ?  ?GOALS: ?Goals reviewed with patient? Yes ?  ?SHORT TERM GOALS: Target date: 11/17/2021 ?  ?Patient with be independent with initial HEP to  improve functional outcomes.  ?  ?Goal status: Ongoing ?  ?2.  Patient will increased distance 2MWT to 350 ft to demonstrate improvement in walking commmunity distances ?Baseline: 339 ?Goal status: Ongoing ?  ?  ?LONG TERM GOALS: Target date: 12/08/2021 ?  ?Patient will be independent with advanced HEP and self management strategies to improve quality of life and functional outcomes. ?  ?Goal status: Ongoing ?  ?2.   Patient will meet predicted FOTO score to demonstrate improved overall function. ?  ?Baseline: 60 ?Goal status: Ongoing ?  ?3.  Patient will report at least 50% improvement in overall symptoms and/or function to demonstrate improved functional mobility ?  ?  ?Goal status:Ongoing ?  ?4.  Patient will improve ambulation distance with 2MWT to 360 ft to demonstrate return to PLOF ?Baseline: 339 ?Goal status: Ongoing ?  ?5.  Patient will improve Left ankle AROM to within 5 degrees all measured motions to improve mobility with navigating uneven surfaces ?Baseline:  ? Rt Lt  ?Ankle dorsiflexion wfl 8  ?Ankle plantarflexion wfl 25  ?Ankle inversion wfl 12  ?Ankle eversion wfl 8  ?  ?Goal status: INITIAL ?  ?  ?PLAN: ?PT FREQUENCY: 1x/week ?  ?PT DURATION: 6 weeks ?  ?PLANNED INTERVENTIONS: Therapeutic exercises, Therapeutic activity, Neuromuscular re-education, Balance training, Gait training, Patient/Family education, Joint manipulation, Joint mobilization, Stair training, Vestibular training, Orthotic/Fit training, DME instructions, Aquatic Therapy, Electrical  stimulation, Cryotherapy, Moist heat, Compression bandaging, Taping, Ultrasound, Parrafin, Fluidotherapy, Contrast bath, Biofeedback, and Manual therapy ?  ?PLAN FOR NEXT SESSION: progress lower extremity strengthening and mobility as able; Progress functional strengthening as able, add STS, squats and step up training.    ?  ?  ? ?Ihor Austin, LPTA/CLT; CBIS ?[phone] ? ?8:17 AM, 11/10/21 ? ? ?  ? ?

## 2021-11-12 ENCOUNTER — Encounter (HOSPITAL_COMMUNITY): Payer: Self-pay

## 2021-11-12 ENCOUNTER — Ambulatory Visit (HOSPITAL_COMMUNITY): Payer: 59

## 2021-11-12 DIAGNOSIS — R2689 Other abnormalities of gait and mobility: Secondary | ICD-10-CM | POA: Diagnosis not present

## 2021-11-12 DIAGNOSIS — S82832D Other fracture of upper and lower end of left fibula, subsequent encounter for closed fracture with routine healing: Secondary | ICD-10-CM

## 2021-11-12 NOTE — Therapy (Signed)
?OUTPATIENT PHYSICAL THERAPY TREATMENT NOTE ? ? ?Patient Name: Veronica Peters ?MRN: [id number] ?DOB:12/22/1970, 51 y.o., female ?Today's Date: 11/12/2021 ? ?PCP: Lupita RaiderShaw, Kimberlee, MD ?REFERRING PROVIDER: Thane EduMark Cairns, MD ? ? PT End of Session - 11/12/21 1710   ? ? Visit Number 4   ? Number of Visits 6   ? Date for PT Re-Evaluation 12/01/21   ? [facility] Type [facility]; 23 visit limit; no auth required   ? PT Start Time 1706   ? PT Stop Time 1745   ? PT Time Calculation (min) 39 min   ? Activity Tolerance Patient tolerated treatment well   ? Behavior [facility] for tasks assessed/performed   ? ?  ?  ? ?  ? ? ? ? ?History reviewed. No pertinent past medical history. ?Past Surgical History:  ?Procedure Laterality Date  ? BREAST ENHANCEMENT SURGERY    ? EYE SURGERY    ? REDUCTION MAMMAPLASTY Bilateral   ? TUBAL LIGATION    ? VESICOVAGINAL FISTULA CLOSURE W/ TAH    ? ?There are no problems to display for this patient. ? ? ?REFERRING DIAG: Left fibula fracture ? ?THERAPY DIAG:  ?Other abnormalities of gait and mobility ? ?Other closed fracture of distal end of left fibula with routine healing, subsequent encounter ? ?SUBJECTIVE: No reports or pain, ankle does feel stiff today.   ? ?PAIN:  ?Are you having pain? No ? ? ? ? ?OBJECTIVE:  ?  ?  ?LE ROM: ?  ?Active ROM Right ?10/27/2021 Left ?10/27/2021  ?Hip flexion      ?Hip extension      ?Hip abduction      ?Hip adduction      ?Hip internal rotation      ?Hip external rotation      ?Knee flexion      ?Knee extension      ?Ankle dorsiflexion wfl 8  ?Ankle plantarflexion wfl 25  ?Ankle inversion wfl 12  ?Ankle eversion wfl 8  ? (Blank rows = not tested) ?FUNCTIONAL TESTS: at eval ?2 minute walk test: 339 ft ?  ?GAIT: at eval ?Distance walked: 339 ?Assistive device utilized: None ?Level of assistance: SBA ?Comments: external rotation Left lower extremity ?  ?  ?  ?TODAY'S TREATMENT: ?11/12/21: ?Rockerboard lateral then DF/PF 2' each ?STS 10x  ?Squat (cueing for  mechanics, front of chair, cueing to reduce valgus, mirror feedback) ?SLS  Lt 31", Rt 39" max  ?Vector stance 3x 5" ?Reciprocal pattern 7in step height 5RT ? Step down 6 in eccentric control 10x ? 12 in knee drive for dorsiflexion 5x 10" ? Slant board 3x 30" ?  ? ?11/05/21: ?Heel raises incline slope 20x 5" ?Toe raises decline slope 20x 5" ?12 in knee drive for dorsiflexion 5x 10" ?SLS Rt 21", Lt 19" ?Vector stance 3x 5" ?Gastroc stretch against wall HEP 3x 30 ?   Soleus 2x 30" against wall HEP  2x 30" ?Tandem stance 1x 30" ?  2x 30" on foam ?Slant board x 2 30" ? ? ? ?11/03/21 ?Nustep level 8, arms 8, x 4' ?Seated Left gastroc stretch with strap 5 x 20" ?Supine ankle AROM In/EV/DF/PF x 10 each ?Heel slides for ankle dorsiflexion x 10 ?Seated self manual plantar flexion stretch 10 x 10" ?BAPS board level 2 CCW and CC x 20 reps each ?8" step, lunge for soleus stretch x 20  ?Shallow squats in // bars x 10 ?Slant board 5 x 20"  ? ? ? ?  ?  PATIENT EDUCATION:  ?Education details: HEP, ice for pain control ?Person educated: Patient ?Education method: Explanation, Demonstration, and Handouts ?Education comprehension: verbalized understanding, returned demonstration, and needs further education ?  ?  ?HOME EXERCISE PROGRAM: ?Access Code: [email or web address] ?URL: [email or web address] ?Date: 11/03/2021 ?Prepared by: [facility] ? ?Exercises ?- Seated Ankle Plantarflexion Dorsiflexion PROM  - 1 x daily - 7 x weekly - 1 sets - 10 reps - 10 sec hold ? ?Exercises ?- Seated Gastroc Stretch with Strap  - 2 x daily - 7 x weekly - 1 sets - 5 reps ?- Supine Ankle Dorsiflexion and Plantarflexion AROM  - 2 x daily - 7 x weekly - 1 sets - 10 reps ?- Supine Ankle Inversion and Eversion AROM  - 2 x daily - 7 x weekly - 10 reps ?- Seated Ankle Dorsiflexion Stretch  - 2 x daily - 7 x weekly - 1 sets - 10 reps ? ?12/04/21: Standing Gastroc/soleus stretch, heel/toe raises. ?  ?ASSESSMENT: ?  ?CLINICAL IMPRESSION: ?Added functional strengthening  activities and continues with ankle mobility and stability.  Pt progressing well towards session.  Demonstrated improved SLS and increase ease with vector stance.  Added STS, squats and stairs. Multimodal cueing with squats for mechanics and equal weight bearing, used mirror feedback to improve awareness.  No reports of pain.  Discussed compression garments to assist with edema control for ankle mobility, stated she owns some already.   ? ?  ?OBJECTIVE IMPAIRMENTS Abnormal gait, decreased activity tolerance, decreased balance, decreased coordination, decreased endurance, decreased knowledge of condition, decreased knowledge of use of DME, decreased mobility, difficulty walking, decreased ROM, decreased strength, decreased safety awareness, hypomobility, increased edema, impaired perceived functional ability, impaired flexibility, impaired tone, and pain.  ?  ?ACTIVITY LIMITATIONS cleaning, community activity, driving, meal prep, occupation, laundry, yard work, shopping, and yard work.  ?  ?  ?REHAB POTENTIAL: Good ?  ?CLINICAL DECISION MAKING: Stable/uncomplicated ?  ?EVALUATION COMPLEXITY: Low ?  ?  ?GOALS: ?Goals reviewed with patient? Yes ?  ?SHORT TERM GOALS: Target date: 11/17/2021 ?  ?Patient with be independent with initial HEP to  improve functional outcomes.  ?  ?Goal status: Ongoing ?  ?2.  Patient will increased distance to 350 ft to demonstrate improvement in walking commmunity distances ?Baseline: 339 ?Goal status: Ongoing ?  ?  ?LONG TERM GOALS: Target date: 12/08/2021 ?  ?Patient will be independent with advanced HEP and self management strategies to improve quality of life and functional outcomes. ?  ?Goal status: Ongoing ?  ?2.   Patient will meet predicted FOTO score to demonstrate improved overall function. ?  ?Baseline: 60 ?Goal status: Ongoing ?  ?3.  Patient will report at least 50% improvement in overall symptoms and/or function to demonstrate improved functional mobility ?  ?  ?Goal  status:Ongoing ?  ?4.  Patient will improve ambulation distance with to 360 ft to demonstrate return to PLOF ?Baseline: 339 ?Goal status: Ongoing ?  ?5.  Patient will improve Left ankle AROM to within 5 degrees all measured motions to improve mobility with navigating uneven surfaces ?Baseline:  ? Rt Lt  ?Ankle dorsiflexion wfl 8  ?Ankle plantarflexion wfl 25  ?Ankle inversion wfl 12  ?Ankle eversion wfl 8  ?  ?Goal status: Ongoing ?  ?  ?PLAN: ?PT FREQUENCY: 1x/week  ?  ?PT DURATION: 6 weeks ?  ?PLANNED INTERVENTIONS: Therapeutic exercises, Therapeutic activity, Neuromuscular re-education, Balance training, Gait training, Patient/Family education, Joint manipulation, Joint mobilization, Stair training,  Vestibular training, Orthotic/Fit training, DME instructions, Aquatic Therapy, Electrical stimulation, Cryotherapy, Moist heat, Compression bandaging, Taping, Ultrasound, Parrafin, Fluidotherapy, Contrast bath, Biofeedback, and Manual therapy ?  ?PLAN FOR NEXT SESSION: progress lower extremity strengthening and mobility as able; Progress functional strengthening as able.  Add warrior and yoga tree pose next session ?  ?  ? ?Becky Sax, LPTA/CLT; CBIS ?[phone] ? ?5:52 PM, 11/12/21 ? ? ?  ? ?

## 2021-11-26 ENCOUNTER — Ambulatory Visit (INDEPENDENT_AMBULATORY_CARE_PROVIDER_SITE_OTHER): Payer: 59

## 2021-11-26 ENCOUNTER — Ambulatory Visit (INDEPENDENT_AMBULATORY_CARE_PROVIDER_SITE_OTHER): Payer: 59 | Admitting: Orthopedic Surgery

## 2021-11-26 ENCOUNTER — Encounter: Payer: Self-pay | Admitting: Orthopedic Surgery

## 2021-11-26 DIAGNOSIS — S82832D Other fracture of upper and lower end of left fibula, subsequent encounter for closed fracture with routine healing: Secondary | ICD-10-CM

## 2021-11-26 NOTE — Progress Notes (Signed)
Return Patient Visit ? ?Assessment: ?Veronica Peters is a 51 y.o. female with the following: ?1.  Left distal fibula fracture, nondisplaced ? ?Plan: ?Pain and swelling has improved.  Radiographs are stable.  Residual swelling and discoloration will continue to improve.  She is using a regular shoe.  Continue to work on exercise, to improve her strength.  Also recommended continued exercises to improve her range of motion.  Follow-up as needed. ? ? ?Follow-up: ?Return if symptoms worsen or fail to improve. ? ?Subjective: ? ?Chief Complaint  ?Patient presents with  ? Ankle Injury  ?  LT fib Fx DOI 09/14/21  ? ? ?History of Present Illness: ?Veronica Peters is a 51 y.o. female who returns for evaluation of a left ankle injury.  Since she was last seen in clinic, she has transitioned out of her walking boot.  She is not having any pain in the left ankle.  Occasionally, she will have some slight discomfort in the lateral aspect of the ankle, but this is not constant.  She notes occasional redness and discoloration in her foot.  This is often associated with some swelling.  The swelling does improve when she gets off of her feet. ? ?Review of Systems: ?No fevers or chills ?No numbness or tingling ?No chest pain ?No shortness of breath ?No bowel or bladder dysfunction ?No GI distress ?No headaches ? ?Objective: ?There were no vitals taken for this visit. ? ?Physical Exam: ? ?General: Alert and oriented. and No acute distress. ?Gait: Nonantalgic gait in a regular shoe. ? ? ?Left ankle without swelling.  No bruising is appreciated.  No tenderness to palpation over the lateral ankle.  5 degrees of dorsiflexion with her knee extended.  No swelling is appreciated.  Toes warm and well-perfused.  Active motion intact in the EHL/TA. ? ?IMAGING: ?I personally reviewed images previously obtained from the ED ? ?X-ray of the left ankle was obtained in clinic today.  No acute injuries are noted.  Previously identified distal fibula  fracture is not easily seen.  No evidence of further displacement.  No callus formation is appreciated. ? ?Impression: Healing left distal fibula fracture ? ?New Medications:  ?No orders of the defined types were placed in this encounter. ? ? ? ? ?Oliver Barre, MD ? ?11/26/2021 ?4:28 PM ? ? ?

## 2021-12-16 ENCOUNTER — Other Ambulatory Visit: Payer: Self-pay | Admitting: Family Medicine

## 2021-12-16 DIAGNOSIS — Z1231 Encounter for screening mammogram for malignant neoplasm of breast: Secondary | ICD-10-CM

## 2021-12-19 ENCOUNTER — Ambulatory Visit
Admission: RE | Admit: 2021-12-19 | Discharge: 2021-12-19 | Disposition: A | Discharge status: Home / Self Care | Payer: 59 | Source: Ambulatory Visit | Attending: Family Medicine | Admitting: Family Medicine

## 2021-12-19 DIAGNOSIS — Z1231 Encounter for screening mammogram for malignant neoplasm of breast: Secondary | ICD-10-CM

## 2021-12-23 ENCOUNTER — Other Ambulatory Visit: Payer: Self-pay | Admitting: Family Medicine

## 2021-12-23 DIAGNOSIS — R928 Other abnormal and inconclusive findings on diagnostic imaging of breast: Secondary | ICD-10-CM

## 2021-12-31 ENCOUNTER — Other Ambulatory Visit: Payer: Self-pay | Admitting: Family Medicine

## 2021-12-31 ENCOUNTER — Ambulatory Visit
Admission: RE | Admit: 2021-12-31 | Discharge: 2021-12-31 | Disposition: A | Discharge status: Home / Self Care | Payer: 59 | Source: Ambulatory Visit | Attending: Family Medicine | Admitting: Family Medicine

## 2021-12-31 DIAGNOSIS — R928 Other abnormal and inconclusive findings on diagnostic imaging of breast: Secondary | ICD-10-CM

## 2021-12-31 DIAGNOSIS — R921 Mammographic calcification found on diagnostic imaging of breast: Secondary | ICD-10-CM

## 2022-01-08 ENCOUNTER — Ambulatory Visit
Admission: RE | Admit: 2022-01-08 | Discharge: 2022-01-08 | Disposition: A | Discharge status: Home / Self Care | Payer: 59 | Source: Ambulatory Visit | Attending: Family Medicine | Admitting: Family Medicine

## 2022-01-08 DIAGNOSIS — R921 Mammographic calcification found on diagnostic imaging of breast: Secondary | ICD-10-CM

## 2022-10-01 ENCOUNTER — Encounter: Payer: Self-pay | Admitting: Radiology

## 2022-10-21 ENCOUNTER — Encounter: Payer: Self-pay | Admitting: Cardiology

## 2022-10-21 ENCOUNTER — Ambulatory Visit: Payer: No Typology Code available for payment source | Admitting: Cardiology

## 2022-10-21 VITALS — BP 163/98 | HR 68 | Ht 64.5 in | Wt 191.0 lb

## 2022-10-21 DIAGNOSIS — E782 Mixed hyperlipidemia: Secondary | ICD-10-CM

## 2022-10-21 DIAGNOSIS — R030 Elevated blood-pressure reading, without diagnosis of hypertension: Secondary | ICD-10-CM

## 2022-10-21 DIAGNOSIS — I700 Atherosclerosis of aorta: Secondary | ICD-10-CM

## 2022-10-21 DIAGNOSIS — R072 Precordial pain: Secondary | ICD-10-CM

## 2022-10-21 DIAGNOSIS — Z8249 Family history of ischemic heart disease and other diseases of the circulatory system: Secondary | ICD-10-CM

## 2022-10-21 MED ORDER — NITROGLYCERIN 0.4 MG SL SUBL: 0.4000 mg | SUBLINGUAL_TABLET | SUBLINGUAL | 0 refills | Status: DC | PRN

## 2022-10-21 NOTE — Progress Notes (Signed)
Primary Physician/Referring:  Mayra Neer, MD  Patient ID: Veronica Peters, female    DOB: 01/25/1971, 52 y.o.   MRN: [id number]  Chief Complaint  Patient presents with   Chest Pain   New Patient (Initial Visit)   HPI:    Veronica Peters  is a 52 y.o. Female patient with mixed hyperlipidemia with intolerance to statins, hypovitaminosis D, obesity, very strong family history of premature coronary disease in her brother who had CABG at age 87 years, referred to me for evaluation of precordial pain while being on a treadmill associated with radiation to her jaw and also to her arms.  She also felt some palpitations.  Over the past several weeks, patient had noticed mild discomfort in her chest and also occasional radiation to her arms and also to her jaw but she had Ignored this.  She had been exercising regularly but recently had quit exercising and had gained the weight back, but wanted to go back to her routine exercise program.  On 10/15/2022, she was on the treadmill, was exercising relatively intensely, started having chest discomfort after she had finished exercising again in the middle of the chest and also felt that it was radiating to her jaw with tingling and numbness in her arms.  She rested for a while as the discomfort did not subside, EMS was called, she was found to be hypotensive and EKG was normal, was recommended that she could potentially go to the hospital if needed, as patient started to feel slightly better she called her physician's office the following day and an appointment was made to see me.  Since then she has not had any significant discomfort like the way she had when she was on the treadmill, on 10/15/2022 although she has gone back to exercising and his exercise twice since then and felt mild tingling and numbness in her jaw and also her arms but has not had any significant chest discomfort.  Past Medical History:  Diagnosis Date   Hyperlipidemia    Past Surgical  History:  Procedure Laterality Date   BREAST ENHANCEMENT SURGERY     EYE SURGERY     REDUCTION MAMMAPLASTY Bilateral    TUBAL LIGATION     VESICOVAGINAL FISTULA CLOSURE W/ TAH     History reviewed. No pertinent family history.  Social History   Tobacco Use   Smoking status: Never   Smokeless tobacco: Never  Substance Use Topics   Alcohol use: No   Marital Status: Divorced  ROS  Review of Systems  Cardiovascular:  Positive for chest pain. Negative for dyspnea on exertion and leg swelling.   Objective      10/21/2022    9:01 AM 09/17/2021    9:35 AM 09/14/2021    5:13 PM  Vitals with BMI  Height 5' 4.5" 5' 4.5"   Weight 191 lbs 180 lbs   BMI [id number]   Systolic [id number]  Diastolic 98 93 [id number]  Pulse 68 72 70   SpO2: 98 %  Physical Exam Constitutional:      Appearance: She is obese.  Neck:     Vascular: No carotid bruit or JVD.  Cardiovascular:     Rate and Rhythm: Normal rate and regular rhythm.     Pulses: Intact distal pulses.     Heart sounds: Normal heart sounds. No murmur heard.    No gallop.  Pulmonary:     Effort: Pulmonary effort is normal.     Breath  sounds: Normal breath sounds.  Abdominal:     General: Bowel sounds are normal.     Palpations: Abdomen is soft.  Musculoskeletal:     Right lower leg: No edema.     Left lower leg: No edema.     Medications and allergies   Allergies  Allergen Reactions   Ezetimibe Other (See Comments)    Dry mouth and tongue numbness  Other Reaction(s): dry mouth and lips/tongue numbness   Pitavastatin Other (See Comments)    Myalgia   Simvastatin Other (See Comments)    Myalgias   Sulfa Antibiotics Hives and Rash   Other     Other Reaction(s): decreased libido   Pravastatin Other (See Comments)     Medication list   Current Outpatient Medications:    LORazepam (ATIVAN) 0.5 MG tablet, Take 0.5 mg by mouth every 8 (eight) hours., Disp: , Rfl:    nitroGLYCERIN (NITROSTAT) 0.4 MG SL tablet, Place 1  tablet (0.4 mg total) under the tongue every 5 (five) minutes as needed for chest pain., Disp: 25 tablet, Rfl: 0   Nutritional Supplements (JUICE PLUS FIBRE PO), Take 2 each by mouth daily., Disp: , Rfl:    aspirin EC 81 MG tablet, Take 81 mg by mouth daily., Disp: , Rfl:  Laboratory examination:   External labs:   Labs 12/20/2021:  Serum glucose 87 mg, BUN 10, creatinine 0.57, EGFR 111 mL, sodium 137, potassium 4.1.  LFTs normal.  Total cholesterol 261, triglycerides 289, HDL 49, LDL 159.  Non-HDL cholesterol 212.  Vitamin D 36.8.   Radiology:   CT lumbar spine 08/30/2016: Minimal aortic atherosclerosis with calcification.  Cardiac Studies:   NA  EKG:   EKG 10/29/2022: Sinus bradycardia/sinus arrhythmia at the rate of 57 bpm, normal EKG.    Assessment     ICD-10-CM   1. Precordial pain  R07.2 EKG 12-Lead    PCV MYOCARDIAL PERFUSION WO LEXISCAN    PCV ECHOCARDIOGRAM COMPLETE    CT CARDIAC SCORING (SELF PAY ONLY)    nitroGLYCERIN (NITROSTAT) 0.4 MG SL tablet    2. Elevated BP without diagnosis of hypertension  R03.0     3. Mixed hypercholesterolemia and hypertriglyceridemia  E78.2 PCV MYOCARDIAL PERFUSION WO LEXISCAN    CT CARDIAC SCORING (SELF PAY ONLY)    4. Family history of premature coronary artery disease Brother with CABG at age 74Y  Z62.49 PCV MYOCARDIAL PERFUSION WO LEXISCAN    CT CARDIAC SCORING (SELF PAY ONLY)    5. Aortic atherosclerosis ([place])  I70.0        Orders Placed This Encounter  Procedures   CT CARDIAC SCORING (SELF PAY ONLY)    Standing Status:   Future    Standing Expiration Date:   12/21/2022    Order Specific Question:   Preferred imaging location?    Answer:   External    Order Specific Question:   Radiology Contrast Protocol - do NOT remove file path    Answer:   [email or web address]    Order Specific Question:   Is patient pregnant?    Answer:   No   PCV MYOCARDIAL PERFUSION WO LEXISCAN    Standing  Status:   Future    Standing Expiration Date:   12/21/2022   EKG 12-Lead   PCV ECHOCARDIOGRAM COMPLETE    Standing Status:   Future    Standing Expiration Date:   10/21/2023    Meds ordered this encounter  Medications   nitroGLYCERIN (NITROSTAT) 0.4 MG SL tablet  Sig: Place 1 tablet (0.4 mg total) under the tongue every 5 (five) minutes as needed for chest pain.    Dispense:  25 tablet    Refill:  0    Medications Discontinued During This Encounter  Medication Reason   buPROPion (WELLBUTRIN XL) 300 MG 24 hr tablet Patient Preference   guaiFENesin (MUCINEX) 600 MG 12 hr tablet Patient Preference   HYDROcodone-acetaminophen (NORCO/VICODIN) 5-325 MG tablet Patient Preference   ipratropium (ATROVENT) 0.03 % nasal spray Patient Preference   loratadine (CLARITIN) 10 MG tablet Patient Preference   methocarbamol (ROBAXIN) 500 MG tablet Patient Preference   naproxen (NAPROSYN) 500 MG tablet Patient Preference   sertraline (ZOLOFT) 100 MG tablet Patient Preference   simvastatin (ZOCOR) 40 MG tablet Patient Preference     Recommendations:   Veronica Peters is a 52 y.o.  Female patient with mixed hyperlipidemia with intolerance to statins, hypovitaminosis D, obesity, very strong family history of premature coronary disease in her brother who had CABG at age 48 years, referred to me for evaluation of precordial pain while being on a treadmill associated with radiation to her jaw and also to her arms.  She also felt some palpitations.  1. Precordial pain Pain symptoms with radiation to her jaw and also to her arms is suggestive of angina pectoris.  She is extremely high risk individual with a very strong family history of premature coronary disease, untreated hypercholesterolemia, and interestingly in 2018 when she had CT scan of the lumbar spine she was noted to have abdominal aortic atherosclerosis with aortic calcification.  This places her at extreme high risk for cardiovascular events and for  coronary artery disease.  I will repeat coronary calcium scoring and nuclear stress test and along with echocardiogram.  I would like to see her back in 4 weeks.  2. Elevated BP without diagnosis of hypertension Blood pressure was markedly elevated when the EMS came to her home, but that could have been related to high stress and anxiety.  Today the blood pressure was elevated as well.  She will monitor her blood pressure closely at home, I will have a low threshold to start her on therapy for hypertension.  3. Mixed hypercholesterolemia and hypertriglyceridemia She has been intolerant to statins, however on questioning she has had mild discomfort in her muscles, we will risk stratify her and then rediscuss regarding options of therapy for hypercholesterolemia which is of mixed variety.  4. Family history of premature coronary artery disease Brother with CABG at age 30Y Extreme high risk especially in view of family history, her brother who I take care of also has coronary disease and has had CABG in his early 50 years of age.  5. Aortic atherosclerosis ([place]) As discussed above CT scan of the abdomen that was done in 2018 already had shown aortic atherosclerosis and aortic calcification placing her in high risk.  Office visit in 4 weeks.  S/L NTG was prescribed and explained how to and when to use it and to notify us if there is change in frequency of use.      Adrian Prows, MD, [facility] 10/21/2022, 9:52 AM Office: [phone]

## 2022-10-29 ENCOUNTER — Ambulatory Visit: Payer: No Typology Code available for payment source

## 2022-10-29 DIAGNOSIS — R072 Precordial pain: Secondary | ICD-10-CM

## 2022-11-03 ENCOUNTER — Other Ambulatory Visit: Payer: 59

## 2022-11-03 ENCOUNTER — Encounter: Payer: Self-pay | Admitting: Cardiology

## 2022-11-19 ENCOUNTER — Other Ambulatory Visit: Payer: 59

## 2022-11-19 ENCOUNTER — Ambulatory Visit: Payer: 59 | Admitting: Cardiology

## 2022-11-26 ENCOUNTER — Ambulatory Visit: Payer: 59

## 2022-11-26 DIAGNOSIS — E782 Mixed hyperlipidemia: Secondary | ICD-10-CM

## 2022-11-26 DIAGNOSIS — R072 Precordial pain: Secondary | ICD-10-CM

## 2022-11-26 DIAGNOSIS — Z8249 Family history of ischemic heart disease and other diseases of the circulatory system: Secondary | ICD-10-CM

## 2022-11-30 ENCOUNTER — Encounter: Payer: Self-pay | Admitting: Cardiology

## 2022-11-30 ENCOUNTER — Ambulatory Visit: Payer: 59 | Admitting: Cardiology

## 2022-11-30 VITALS — BP 121/82 | HR 72 | Ht 64.5 in | Wt 189.0 lb

## 2022-11-30 DIAGNOSIS — R072 Precordial pain: Secondary | ICD-10-CM

## 2022-11-30 DIAGNOSIS — E7800 Pure hypercholesterolemia, unspecified: Secondary | ICD-10-CM

## 2022-11-30 DIAGNOSIS — Z8249 Family history of ischemic heart disease and other diseases of the circulatory system: Secondary | ICD-10-CM

## 2022-11-30 DIAGNOSIS — E782 Mixed hyperlipidemia: Secondary | ICD-10-CM

## 2022-11-30 MED ORDER — SIMVASTATIN 20 MG PO TABS: 20.0000 mg | ORAL_TABLET | Freq: Every day | ORAL | 2 refills | Status: AC

## 2022-11-30 NOTE — Progress Notes (Signed)
Primary Physician/Referring:  Lupita Raider, MD  Patient ID: Veronica Peters, female    DOB: 11-Aug-1970, 52 y.o.   MRN: [id number]  Chief Complaint  Patient presents with   Precordial pain   Follow-up   HPI:    Veronica Peters  is a 52 y.o. Female patient with mixed hyperlipidemia with intolerance to statins, hypovitaminosis D, obesity, very strong family history of premature coronary disease in her brother who had CABG at age 54 years, referred to me for evaluation of precordial pain while being on a treadmill associated with radiation to her jaw and also to her arms.  She also felt some palpitations.  Over the past several weeks, patient had noticed mild discomfort in her chest and also occasional radiation to her arms and also to her jaw but she had Ignored this.  She had been exercising regularly but recently had quit exercising and had gained the weight back, but wanted to go back to her routine exercise program.  On 10/15/2022, she was on the treadmill, was exercising relatively intensely, started having chest discomfort after she had finished exercising again in the middle of the chest and also felt that it was radiating to her jaw with tingling and numbness in her arms.  She rested for a while as the discomfort did not subside, EMS was called, she was found to be hypotensive and EKG was normal, was recommended that she could potentially go to the hospital if needed, as patient started to feel slightly better she called her physician's office the following day and an appointment was made to see me.  Since then she has not had any significant discomfort like the way she had when she was on the treadmill, on 10/15/2022 although she has gone back to exercising and his exercise twice since then and felt mild tingling and numbness in her jaw and also her arms but has not had any significant chest discomfort.  Past Medical History:  Diagnosis Date   Hyperlipidemia    Past Surgical History:   Procedure Laterality Date   BREAST ENHANCEMENT SURGERY     EYE SURGERY     REDUCTION MAMMAPLASTY Bilateral    TUBAL LIGATION     VESICOVAGINAL FISTULA CLOSURE W/ TAH     History reviewed. No pertinent family history.  Social History   Tobacco Use   Smoking status: Never   Smokeless tobacco: Never  Substance Use Topics   Alcohol use: No    Comment: social   Marital Status: Divorced  ROS  Review of Systems  Cardiovascular:  Positive for chest pain. Negative for dyspnea on exertion and leg swelling.   Objective      11/30/2022    3:48 PM 10/21/2022    9:01 AM 09/17/2021    9:35 AM  Vitals with BMI  Height 5' 4.5" 5' 4.5" 5' 4.5"  Weight 189 lbs 191 lbs 180 lbs  BMI 31.95 32.29 30.43  Systolic 121 163 409  Diastolic 82 98 93  Pulse 72 68 72   SpO2: 99 %  Physical Exam Constitutional:      Appearance: She is obese.  Neck:     Vascular: No carotid bruit or JVD.  Cardiovascular:     Rate and Rhythm: Normal rate and regular rhythm.     Pulses: Intact distal pulses.     Heart sounds: Normal heart sounds. No murmur heard.    No gallop.  Pulmonary:     Effort: Pulmonary effort is normal.  Breath sounds: Normal breath sounds.  Abdominal:     General: Bowel sounds are normal.     Palpations: Abdomen is soft.  Musculoskeletal:     Right lower leg: No edema.     Left lower leg: No edema.    Medications and allergies   Allergies  Allergen Reactions   Ezetimibe Other (See Comments)    Dry mouth and tongue numbness  Other Reaction(s): dry mouth and lips/tongue numbness   Pitavastatin Other (See Comments)    Myalgia   Simvastatin Other (See Comments)    Myalgias   Sulfa Antibiotics Hives and Rash   Other     Other Reaction(s): decreased libido   Pravastatin Other (See Comments)     Medication list   Current Outpatient Medications:    Apple Cider Vinegar 188 MG CAPS, as directed Orally, Disp: , Rfl:    Elderberry 500 MG CAPS, Elderberry, Disp: , Rfl:     LORazepam (ATIVAN) 0.5 MG tablet, Take 0.5 mg by mouth every 8 (eight) hours., Disp: , Rfl:    Nutritional Supplements (JUICE PLUS FIBRE PO), Take 2 each by mouth daily., Disp: , Rfl:    simvastatin (ZOCOR) 20 MG tablet, Take 1 tablet (20 mg total) by mouth at bedtime., Disp: 30 tablet, Rfl: 2 Laboratory examination:   External labs:   Labs 12/20/2021:  Serum glucose 87 mg, BUN 10, creatinine 0.57, EGFR 111 mL, sodium 137, potassium 4.1.  LFTs normal.  Total cholesterol 261, triglycerides 289, HDL 49, LDL 159.  Non-HDL cholesterol 212.  Vitamin D 36.8.   Radiology:   CT lumbar spine 08/30/2016: Minimal aortic atherosclerosis with calcification.  Cardiac Studies:   Coronary calcium score 10/28/2022: - Total Calcium Score: 0.  -No significant extracardiac abnormality.  PCV ECHOCARDIOGRAM COMPLETE 10/29/2022  Narrative Echocardiogram 10/29/2022: Left ventricle cavity is normal in size and wall thickness. Normal global wall motion. Normal LV systolic function with visual EF 55-60%. Normal diastolic filling pattern. No significant valvular abnormality. No evidence of pulmonary hypertension.   PCV MYOCARDIAL PERFUSION WO LEXISCAN 11/26/2022  Narrative Exercise nuclear stress test 11/26/2022: Myocardial perfusion is normal. Overall LV systolic function is normal without regional wall motion abnormalities. Stress LV EF: 53%. Normal ECG stress. The patient exercised for 7 minutes of a Bruce protocol, achieving approximately 9.87 METs & 87% MPHR. The blood pressure response was hypertensive. The baseline blood pressure was 140/90 mmHg and increased to 200/110 mmHg. No previous exam available for comparison. Low risk.    EKG:   EKG 10/29/2022: Sinus bradycardia/sinus arrhythmia at the rate of 57 bpm, normal EKG.    Assessment     ICD-10-CM   1. Pure hypercholesterolemia  E78.00 simvastatin (ZOCOR) 20 MG tablet    2. Precordial pain  R07.2     3. Mixed hyperlipidemia  E78.2      4. Family history of premature coronary artery disease Brother with CABG at age 12Y  Z79.49        No orders of the defined types were placed in this encounter.   Meds ordered this encounter  Medications   simvastatin (ZOCOR) 20 MG tablet    Sig: Take 1 tablet (20 mg total) by mouth at bedtime.    Dispense:  30 tablet    Refill:  2    Medications Discontinued During This Encounter  Medication Reason   aspirin EC 81 MG tablet Discontinued by provider   nitroGLYCERIN (NITROSTAT) 0.4 MG SL tablet Discontinued by provider     Recommendations:  Veronica Peters is a 52 y.o.  Female patient with mixed hyperlipidemia with intolerance to statins, hypovitaminosis D, obesity, very strong family history of premature coronary disease in her brother who had CABG at age 80 years, referred to me for evaluation of precordial pain while being on a treadmill associated with radiation to her jaw and also to her arms.  She also felt some palpitations.  1.  Precordial pain Patient has not had any further chest pain since I last seen her.  I reviewed the results of the stress test and echocardiogram and reassured her.  Suspect her chest pain could be related to GERD.  Advised her to try PPI and see how she does.  Still if she has persistent pain, she can certainly contact us.  2. Mixed hyperlipidemia Patient presented 3 months ago with chest pain with radiation to the neck during exercise, and review of strong family history of premature coronary disease and hypercholesterolemia, underwent further cardiac evaluation presents for follow-up.  She has not had any further chest pain.  I reviewed the results of the stress test and echocardiogram, advised the patient that her chest pain symptoms could be related to acid reflux as well.  However I also discussed regarding chest pain with normal coronary arteries or mild coronary artery disease as well.  Continued primary prevention and risk factor modification  again discussed with the patient. After long discussion, advised her that she could try very low-dose of simvastatin which she has not tried in the past.  She has tried several statins including Zetia and has not been able to tolerate.  In the absence of elevated coronary calcium score, normal stress test, hard to have insurance approval for injectables.  As long as she tolerates even minimal dose of statins, it would probably give her the most cardiovascular protection.  She will try to uptitrate simvastatin as much as she can tolerate without the side effects and will follow-up with her PCP regarding statin dose.  Advised her that she could even go up and escalate the dose up to 40 mg once a day.  If everything else fails, she could try red yeast rice.  Niacin 500 mg daily would also be an option.  No indication for aspirin, no indication for nitroglycerin which have discontinued. - simvastatin (ZOCOR) 20 MG tablet; Take 1 tablet (20 mg total) by mouth at bedtime.  Dispense: 30 tablet; Refill: 2  3. Family history of premature coronary artery disease Brother with CABG at age 3Y As dictated above, will try to do the best we can to control her lipids as much as we can.  4.  Elevated blood pressure:  Patient has probably labile hypertension or prehypertension.  Suspect with weight loss and exercise and also starting statin therapy, she may not need to be on a antihypertensive medication.  She will continue to monitor her blood pressure at various times.  Today the blood pressure was very well-controlled at rest.    Yates Decamp, MD, [facility] 11/30/2022, 10:24 PM Office: [phone]

## 2022-12-21 ENCOUNTER — Other Ambulatory Visit: Payer: Self-pay | Admitting: Family Medicine

## 2022-12-21 DIAGNOSIS — Z0000 Encounter for general adult medical examination without abnormal findings: Secondary | ICD-10-CM

## 2023-01-11 ENCOUNTER — Ambulatory Visit
Admission: RE | Admit: 2023-01-11 | Discharge: 2023-01-11 | Disposition: A | Discharge status: Home / Self Care | Payer: 59 | Source: Ambulatory Visit | Attending: Family Medicine | Admitting: Family Medicine

## 2023-01-11 DIAGNOSIS — Z0000 Encounter for general adult medical examination without abnormal findings: Secondary | ICD-10-CM

## 2023-08-26 ENCOUNTER — Encounter (HOSPITAL_COMMUNITY): Payer: Self-pay

## 2023-08-26 ENCOUNTER — Emergency Department (HOSPITAL_COMMUNITY)
Admission: EM | Admit: 2023-08-26 | Discharge: 2023-08-27 | Disposition: A | Discharge status: Home / Self Care | Payer: 59 | Attending: Emergency Medicine | Admitting: Emergency Medicine

## 2023-08-26 ENCOUNTER — Other Ambulatory Visit: Payer: Self-pay

## 2023-08-26 DIAGNOSIS — S0501XA Injury of conjunctiva and corneal abrasion without foreign body, right eye, initial encounter: Secondary | ICD-10-CM | POA: Diagnosis present

## 2023-08-26 DIAGNOSIS — W228XXA Striking against or struck by other objects, initial encounter: Secondary | ICD-10-CM | POA: Diagnosis not present

## 2023-08-26 NOTE — ED Triage Notes (Signed)
Pov from home. Cc of right eye pain. Hit it while under the sink. Under 4 pm. Tried to see an eye doctor but they wouldn't see her because she isnt a patient there.  Able to see out of it but it hurts and is watery.

## 2023-08-27 ENCOUNTER — Emergency Department (HOSPITAL_COMMUNITY): Payer: 59

## 2023-08-27 MED ORDER — FLUORESCEIN SODIUM 1 MG OP STRP
1.0000 | ORAL_STRIP | Freq: Once | OPHTHALMIC | Status: AC
  Administered 2023-08-27: 1 via OPHTHALMIC
  Filled 2023-08-27: qty 1

## 2023-08-27 MED ORDER — KETOROLAC TROMETHAMINE 0.5 % OP SOLN
1.0000 [drp] | Freq: Once | OPHTHALMIC | Status: AC
  Administered 2023-08-27: 1 [drp] via OPHTHALMIC
  Filled 2023-08-27: qty 5

## 2023-08-27 MED ORDER — PROPARACAINE HCL 0.5 % OP SOLN
1.0000 [drp] | Freq: Once | OPHTHALMIC | Status: DC
  Filled 2023-08-27: qty 15

## 2023-08-27 MED ORDER — TETRACAINE HCL 0.5 % OP SOLN
1.0000 [drp] | Freq: Once | OPHTHALMIC | Status: AC
  Administered 2023-08-27: 1 [drp] via OPHTHALMIC

## 2023-08-27 MED ORDER — ERYTHROMYCIN 5 MG/GM OP OINT
TOPICAL_OINTMENT | Freq: Four times a day (QID) | OPHTHALMIC | Status: DC
  Filled 2023-08-27: qty 3.5

## 2023-08-27 NOTE — ED Provider Notes (Addendum)
[place] EMERGENCY DEPARTMENT AT [facility] Provider Note   CSN: [id number] Arrival date & time: 08/26/23  2312     History  No chief complaint on file.   Veronica Peters is a 53 y.o. female.  Presents to the emergency for evaluation of eye injury.  Patient reports that she was looking around under her kitchen sink and hit her eye on something under the sink.  She was wearing glasses at the time, the glasses fell off of her face.  She reports that there was a small amount of redness on the lower eyelid and some eye discomfort initially.  She was unable to get an appointment with an eye doctor, since then the pain has progressively worsened, now having light sensitivity.       Home Medications Prior to Admission medications   Medication Sig Start Date End Date Taking? Authorizing Provider  Apple Cider Vinegar 188 MG CAPS as directed Orally    [provider]  Elderberry 500 MG CAPS Elderberry    [provider]  LORazepam (ATIVAN) 0.5 MG tablet Take 0.5 mg by mouth every 8 (eight) hours.    [provider]  Nutritional Supplements (JUICE PLUS FIBRE PO) Take 2 each by mouth daily.    [provider]  simvastatin (ZOCOR) 20 MG tablet Take 1 tablet (20 mg total) by mouth at bedtime. 11/30/22 02/28/23  Yates Decamp, MD      Allergies    Ezetimibe, Pitavastatin, Simvastatin, Sulfa antibiotics, Other, and Pravastatin    Review of Systems   Review of Systems  Physical Exam Updated Vital Signs BP (!) 156/104   Pulse 79   Temp 98.1 F (36.7 C) (Oral)   Resp 20   Ht 5\' 5"  (1.651 m)   Wt 74.8 kg   SpO2 99%   BMI 27.46 kg/m  Physical Exam Vitals and nursing note reviewed.  Constitutional:      General: She is not in acute distress.    Appearance: She is well-developed.  HENT:     Head: Normocephalic and atraumatic.     Mouth/Throat:     Mouth: Mucous membranes are moist.  Eyes:     General: Vision grossly intact. Gaze aligned  appropriately.     Extraocular Movements: Extraocular movements intact.     Conjunctiva/sclera: Conjunctivae normal.     Pupils:     Right eye: Corneal abrasion (Periphery at 7:00) and fluorescein uptake present. Seidel exam negative.   Cardiovascular:     Rate and Rhythm: Normal rate and regular rhythm.     Pulses: Normal pulses.     Heart sounds: Normal heart sounds, S1 normal and S2 normal. No murmur heard.    No friction rub. No gallop.  Pulmonary:     Effort: Pulmonary effort is normal. No respiratory distress.     Breath sounds: Normal breath sounds.  Abdominal:     General: Bowel sounds are normal.     Palpations: Abdomen is soft.     Tenderness: There is no abdominal tenderness. There is no guarding or rebound.     Hernia: No hernia is present.  Musculoskeletal:        General: No swelling.     Cervical back: Full passive range of motion without pain, normal range of motion and neck supple. No spinous process tenderness or muscular tenderness. Normal range of motion.     Right lower leg: No edema.     Left lower leg: No edema.  Skin:    General: Skin is warm and dry.     Capillary Refill: Capillary refill takes less than 2 seconds.     Findings: No ecchymosis, erythema, rash or wound.  Neurological:     General: No focal deficit present.     Mental Status: She is alert and oriented to person, place, and time.     GCS: GCS eye subscore is 4. GCS verbal subscore is 5. GCS motor subscore is 6.     Cranial Nerves: Cranial nerves 2-12 are intact.     Sensory: Sensation is intact.     Motor: Motor function is intact.     Coordination: Coordination is intact.  Psychiatric:        Attention and Perception: Attention normal.        Mood and Affect: Mood normal.        Speech: Speech normal.        Behavior: Behavior normal.     ED Results / Procedures / Treatments   Labs (all labs ordered are listed, but only abnormal results are displayed) Labs Reviewed - No data to  display  EKG None  Radiology CT Orbits Wo Contrast Result Date: 08/27/2023 CLINICAL DATA:  Right eye pain after hitting it under the sink. EXAM: CT ORBITS WITHOUT CONTRAST TECHNIQUE: Multidetector CT imaging of the orbits was performed using the standard protocol without intravenous contrast. Multiplanar CT image reconstructions were also generated. RADIATION DOSE REDUCTION: This exam was performed according to the departmental dose-optimization program which includes automated exposure control, adjustment of the mA and/or kV according to patient size and/or use of iterative reconstruction technique. COMPARISON:  None Available. FINDINGS: Orbits: Metallic triangular density adjacent to the lateral left globe measuring 6 mm with adjacent small locules of gas. The right globe and orbit are unremarkable. No evidence of inflammation. Normal appearance optic nerve-sheath complexes, extraocular muscles, orbital fat and lacrimal glands. Visible paranasal sinuses: Clear. Soft tissues: Normal. Osseous: No acute fracture. Limited intracranial: No acute or significant finding. IMPRESSION: 1. Metallic triangular density adjacent to the lateral LEFT globe measuring 6 mm with adjacent small locules of gas. Query foreign body. 2. Unremarkable RIGHT orbit. Electronically Signed   By: Minerva Fester M.D.   On: 08/27/2023 00:52    Procedures Procedures    Medications Ordered in ED Medications  ketorolac (ACULAR) 0.5 % ophthalmic solution 1 drop (has no administration in time range)  erythromycin ophthalmic ointment (has no administration in time range)  fluorescein ophthalmic strip 1 strip (1 strip Right Eye Given 08/27/23 0103)  tetracaine (PONTOCAINE) 0.5 % ophthalmic solution 1 drop (1 drop Right Eye Given 08/27/23 0104)    ED Course/ Medical Decision Making/ A&P                                 Medical Decision Making Amount and/or Complexity of Data Reviewed Radiology: ordered.  Risk Prescription  drug management.   Patient suffered minor trauma to the eye area earlier today and has had progressively worsening pain.  Patient with light sensitivity, likely slight traumatic iritis.  She does have a small corneal abrasion on the periphery of her cornea at the 7 o'clock position.  No evidence of globe rupture.  No evidence of orbital fracture.  There is what appears to be incidental finding foreign body in the left orbit -discussed with patient and this is sequela from surgery that she had many years ago  for tear duct problems.      Final Clinical Impression(s) / ED Diagnoses Final diagnoses:  Abrasion of right cornea, initial encounter    Rx / DC Orders ED Discharge Orders     None         Merrin Mcvicker, Canary Brim, MD 08/27/23 [id number]    Gilda Crease, MD 08/27/23 [phone]

## 2023-08-27 NOTE — ED Notes (Signed)
Patient transported to CT

## 2024-03-24 ENCOUNTER — Encounter: Payer: Self-pay | Admitting: Radiology

## 2024-06-05 ENCOUNTER — Encounter: Payer: Self-pay | Admitting: Radiology
